# Patient Record
Sex: Female | Born: 1940 | Race: White | Hispanic: No | Marital: Married | Smoking: Former smoker
Health system: Southern US, Community
[De-identification: ages and names within clinical notes are randomized; demographics above are authoritative.]

## PROBLEM LIST (undated history)

## (undated) DIAGNOSIS — I10 Essential (primary) hypertension: Secondary | ICD-10-CM

## (undated) HISTORY — PX: ABDOMINAL HYSTERECTOMY: SHX81

## (undated) HISTORY — PX: CHOLECYSTECTOMY: SHX55

## (undated) HISTORY — PX: APPENDECTOMY: SHX54

## (undated) HISTORY — PX: TONSILLECTOMY: SUR1361

---

## 2012-11-07 ENCOUNTER — Emergency Department: Payer: Self-pay | Admitting: Emergency Medicine

## 2014-04-03 ENCOUNTER — Emergency Department: Payer: Self-pay | Admitting: Internal Medicine

## 2014-09-06 ENCOUNTER — Emergency Department: Payer: Self-pay | Admitting: Emergency Medicine

## 2015-06-09 ENCOUNTER — Encounter: Payer: Self-pay | Admitting: Emergency Medicine

## 2015-06-09 ENCOUNTER — Ambulatory Visit
Admission: EM | Admit: 2015-06-09 | Discharge: 2015-06-09 | Disposition: A | Discharge status: Home / Self Care | Payer: Medicare Other | Attending: Family Medicine | Admitting: Family Medicine

## 2015-06-09 DIAGNOSIS — J0110 Acute frontal sinusitis, unspecified: Secondary | ICD-10-CM

## 2015-06-09 DIAGNOSIS — H6593 Unspecified nonsuppurative otitis media, bilateral: Secondary | ICD-10-CM | POA: Diagnosis not present

## 2015-06-09 DIAGNOSIS — J209 Acute bronchitis, unspecified: Secondary | ICD-10-CM

## 2015-06-09 DIAGNOSIS — J029 Acute pharyngitis, unspecified: Secondary | ICD-10-CM | POA: Diagnosis not present

## 2015-06-09 HISTORY — DX: Essential (primary) hypertension: I10

## 2015-06-09 MED ORDER — FLUTICASONE PROPIONATE 50 MCG/ACT NA SUSP: 1.0000 | Freq: Two times a day (BID) | NASAL | Status: AC

## 2015-06-09 MED ORDER — ALBUTEROL SULFATE HFA 108 (90 BASE) MCG/ACT IN AERS: 1.0000 | INHALATION_SPRAY | RESPIRATORY_TRACT | Status: DC | PRN

## 2015-06-09 MED ORDER — SALINE SPRAY 0.65 % NA SOLN: 2.0000 | NASAL | Status: AC

## 2015-06-09 MED ORDER — DOXYCYCLINE HYCLATE 100 MG PO CAPS: 100.0000 mg | ORAL_CAPSULE | Freq: Two times a day (BID) | ORAL | Status: AC

## 2015-06-09 MED ORDER — MONTELUKAST SODIUM 10 MG PO TABS: 10.0000 mg | ORAL_TABLET | Freq: Every day | ORAL | Status: AC

## 2015-06-09 NOTE — Discharge Instructions (Signed)
Acute Bronchitis °Bronchitis is inflammation of the airways that extend from the windpipe into the lungs (bronchi). The inflammation often causes mucus to develop. This leads to a cough, which is the most common symptom of bronchitis.  °In acute bronchitis, the condition usually develops suddenly and goes away over time, usually in a couple weeks. Smoking, allergies, and asthma can make bronchitis worse. Repeated episodes of bronchitis may cause further lung problems.  °CAUSES °Acute bronchitis is most often caused by the same virus that causes a cold. The virus can spread from person to person (contagious) through coughing, sneezing, and touching contaminated objects. °SIGNS AND SYMPTOMS  °· Cough.   °· Fever.   °· Coughing up mucus.   °· Body aches.   °· Chest congestion.   °· Chills.   °· Shortness of breath.   °· Sore throat.   °DIAGNOSIS  °Acute bronchitis is usually diagnosed through a physical exam. Your health care provider will also ask you questions about your medical history. Tests, such as chest X-rays, are sometimes done to rule out other conditions.  °TREATMENT  °Acute bronchitis usually goes away in a couple weeks. Oftentimes, no medical treatment is necessary. Medicines are sometimes given for relief of fever or cough. Antibiotic medicines are usually not needed but may be prescribed in certain situations. In some cases, an inhaler may be recommended to help reduce shortness of breath and control the cough. A cool mist vaporizer may also be used to help thin bronchial secretions and make it easier to clear the chest.  °HOME CARE INSTRUCTIONS °· Get plenty of rest.   °· Drink enough fluids to keep your urine clear or pale yellow (unless you have a medical condition that requires fluid restriction). Increasing fluids may help thin your respiratory secretions (sputum) and reduce chest congestion, and it will prevent dehydration.   °· Take medicines only as directed by your health care provider. °· If  you were prescribed an antibiotic medicine, finish it all even if you start to feel better. °· Avoid smoking and secondhand smoke. Exposure to cigarette smoke or irritating chemicals will make bronchitis worse. If you are a smoker, consider using nicotine gum or skin patches to help control withdrawal symptoms. Quitting smoking will help your lungs heal faster.   °· Reduce the chances of another bout of acute bronchitis by washing your hands frequently, avoiding people with cold symptoms, and trying not to touch your hands to your mouth, nose, or eyes.   °· Keep all follow-up visits as directed by your health care provider.   °SEEK MEDICAL CARE IF: °Your symptoms do not improve after 1 week of treatment.  °SEEK IMMEDIATE MEDICAL CARE IF: °· You develop an increased fever or chills.   °· You have chest pain.   °· You have severe shortness of breath. °· You have bloody sputum.   °· You develop dehydration. °· You faint or repeatedly feel like you are going to pass out. °· You develop repeated vomiting. °· You develop a severe headache. °MAKE SURE YOU:  °· Understand these instructions. °· Will watch your condition. °· Will get help right away if you are not doing well or get worse. °  °This information is not intended to replace advice given to you by your health care provider. Make sure you discuss any questions you have with your health care provider. °  °Document Released: 08/22/2004 Document Revised: 08/05/2014 Document Reviewed: 01/05/2013 °[employer] Interactive Patient Education ©2016 [employer] °Sinusitis, Adult °Sinusitis is redness, soreness, and inflammation of the paranasal sinuses. Paranasal sinuses are air pockets within the   bones of your face. They are located beneath your eyes, in the middle of your forehead, and above your eyes. In healthy paranasal sinuses, mucus is able to drain out, and air is able to circulate through them by way of your nose. However, when your paranasal sinuses are inflamed,  mucus and air can become trapped. This can allow bacteria and other germs to grow and cause infection. °Sinusitis can develop quickly and last only a short time (acute) or continue over a long period (chronic). Sinusitis that lasts for more than 12 weeks is considered chronic. °CAUSES °Causes of sinusitis include: °· Allergies. °· Structural abnormalities, such as displacement of the cartilage that separates your nostrils (deviated septum), which can decrease the air flow through your nose and sinuses and affect sinus drainage. °· Functional abnormalities, such as when the small hairs (cilia) that line your sinuses and help remove mucus do not work properly or are not present. °SIGNS AND SYMPTOMS °Symptoms of acute and chronic sinusitis are the same. The primary symptoms are pain and pressure around the affected sinuses. Other symptoms include: °· Upper toothache. °· Earache. °· Headache. °· Bad breath. °· Decreased sense of smell and taste. °· A cough, which worsens when you are lying flat. °· Fatigue. °· Fever. °· Thick drainage from your nose, which often is green and may contain pus (purulent). °· Swelling and warmth over the affected sinuses. °DIAGNOSIS °Your health care provider will perform a physical exam. During your exam, your health care provider may perform any of the following to help determine if you have acute sinusitis or chronic sinusitis: °· Look in your nose for signs of abnormal growths in your nostrils (nasal polyps). °· Tap over the affected sinus to check for signs of infection. °· View the inside of your sinuses using an imaging device that has a light attached (endoscope). °If your health care provider suspects that you have chronic sinusitis, one or more of the following tests may be recommended: °· Allergy tests. °· Nasal culture. A sample of mucus is taken from your nose, sent to a lab, and screened for bacteria. °· Nasal cytology. A sample of mucus is taken from your nose and examined by  your health care provider to determine if your sinusitis is related to an allergy. °TREATMENT °Most cases of acute sinusitis are related to a viral infection and will resolve on their own within 10 days. Sometimes, medicines are prescribed to help relieve symptoms of both acute and chronic sinusitis. These may include pain medicines, decongestants, nasal steroid sprays, or saline sprays. °However, for sinusitis related to a bacterial infection, your health care provider will prescribe antibiotic medicines. These are medicines that will help kill the bacteria causing the infection. °Rarely, sinusitis is caused by a fungal infection. In these cases, your health care provider will prescribe antifungal medicine. °For some cases of chronic sinusitis, surgery is needed. Generally, these are cases in which sinusitis recurs more than 3 times per year, despite other treatments. °HOME CARE INSTRUCTIONS °· Drink plenty of water. Water helps thin the mucus so your sinuses can drain more easily. °· Use a humidifier. °· Inhale steam 3-4 times a day (for example, sit in the bathroom with the shower running). °· Apply a warm, moist washcloth to your face 3-4 times a day, or as directed by your health care provider. °· Use saline nasal sprays to help moisten and clean your sinuses. °· Take medicines only as directed by your health care provider. °· If   you were prescribed either an antibiotic or antifungal medicine, finish it all even if you start to feel better. °SEEK IMMEDIATE MEDICAL CARE IF: °· You have increasing pain or severe headaches. °· You have nausea, vomiting, or drowsiness. °· You have swelling around your face. °· You have vision problems. °· You have a stiff neck. °· You have difficulty breathing. °  °This information is not intended to replace advice given to you by your health care provider. Make sure you discuss any questions you have with your health care provider. °  °Document Released: 07/15/2005 Document  Revised: 08/05/2014 Document Reviewed: 07/30/2011 °[employer] Interactive Patient Education ©2016 [employer] °Pharyngitis °Pharyngitis is redness, pain, and swelling (inflammation) of your pharynx.  °CAUSES  °Pharyngitis is usually caused by infection. Most of the time, these infections are from viruses (viral) and are part of a cold. However, sometimes pharyngitis is caused by bacteria (bacterial). Pharyngitis can also be caused by allergies. Viral pharyngitis may be spread from person to person by coughing, sneezing, and personal items or utensils (cups, forks, spoons, toothbrushes). Bacterial pharyngitis may be spread from person to person by more intimate contact, such as kissing.  °SIGNS AND SYMPTOMS  °Symptoms of pharyngitis include:   °· Sore throat.   °· Tiredness (fatigue).   °· Low-grade fever.   °· Headache. °· Joint pain and muscle aches. °· Skin rashes. °· Swollen lymph nodes. °· Plaque-like film on throat or tonsils (often seen with bacterial pharyngitis). °DIAGNOSIS  °Your health care provider will ask you questions about your illness and your symptoms. Your medical history, along with a physical exam, is often all that is needed to diagnose pharyngitis. Sometimes, a rapid strep test is done. Other lab tests may also be done, depending on the suspected cause.  °TREATMENT  °Viral pharyngitis will usually get better in 3-4 days without the use of medicine. Bacterial pharyngitis is treated with medicines that kill germs (antibiotics).  °HOME CARE INSTRUCTIONS  °· Drink enough water and fluids to keep your urine clear or pale yellow.   °· Only take over-the-counter or prescription medicines as directed by your health care provider:   °· If you are prescribed antibiotics, make sure you finish them even if you start to feel better.   °· Do not take aspirin.   °· Get lots of rest.   °· Gargle with 8 oz of salt water (½ tsp of salt per 1 qt of water) as often as every 1-2 hours to soothe your throat.    °· Throat lozenges (if you are not at risk for choking) or sprays may be used to soothe your throat. °SEEK MEDICAL CARE IF:  °· You have large, tender lumps in your neck. °· You have a rash. °· You cough up green, yellow-brown, or bloody spit. °SEEK IMMEDIATE MEDICAL CARE IF:  °· Your neck becomes stiff. °· You drool or are unable to swallow liquids. °· You vomit or are unable to keep medicines or liquids down. °· You have severe pain that does not go away with the use of recommended medicines. °· You have trouble breathing (not caused by a stuffy nose). °MAKE SURE YOU:  °· Understand these instructions. °· Will watch your condition. °· Will get help right away if you are not doing well or get worse. °  °This information is not intended to replace advice given to you by your health care provider. Make sure you discuss any questions you have with your health care provider. °  °Document Released: 07/15/2005 Document Revised: 05/05/2013 Document Reviewed: 03/22/2013 °  [employer] Interactive Patient Education ©2016 [employer] °Otitis Media With Effusion °Otitis media with effusion is the presence of fluid in the middle ear. This is a common problem in children, which often follows ear infections. It may be present for weeks or longer after the infection. Unlike an acute ear infection, otitis media with effusion refers only to fluid behind the ear drum and not infection. Children with repeated ear and sinus infections and allergy problems are the most likely to get otitis media with effusion. °CAUSES  °The most frequent cause of the fluid buildup is dysfunction of the eustachian tubes. These are the tubes that drain fluid in the ears to the back of the nose (nasopharynx). °SYMPTOMS  °· The main symptom of this condition is hearing loss. As a result, you or your child may: °· Listen to the TV at a loud volume. °· Not respond to questions. °· Ask "what" often when spoken to. °· Mistake or confuse one sound or word for  another. °· There may be a sensation of fullness or pressure but usually not pain. °DIAGNOSIS  °· Your health care provider will diagnose this condition by examining you or your child's ears. °· Your health care provider may test the pressure in you or your child's ear with a tympanometer. °· A hearing test may be conducted if the problem persists. °TREATMENT  °· Treatment depends on the duration and the effects of the effusion. °· Antibiotics, decongestants, nose drops, and cortisone-type drugs (tablets or nasal spray) may not be helpful. °· Children with persistent ear effusions may have delayed language or behavioral problems. Children at risk for developmental delays in hearing, learning, and speech may require referral to a specialist earlier than children not at risk. °· You or your child's health care provider may suggest a referral to an ear, nose, and throat surgeon for treatment. The following may help restore normal hearing: °· Drainage of fluid. °· Placement of ear tubes (tympanostomy tubes). °· Removal of adenoids (adenoidectomy). °HOME CARE INSTRUCTIONS  °· Avoid secondhand smoke. °· Infants who are breastfed are less likely to have this condition. °· Avoid feeding infants while they are lying flat. °· Avoid known environmental allergens. °· Avoid people who are sick. °SEEK MEDICAL CARE IF:  °· Hearing is not better in 3 months. °· Hearing is worse. °· Ear pain. °· Drainage from the ear. °· Dizziness. °MAKE SURE YOU:  °· Understand these instructions. °· Will watch your condition. °· Will get help right away if you are not doing well or get worse. °  °This information is not intended to replace advice given to you by your health care provider. Make sure you discuss any questions you have with your health care provider. °  °Document Released: 08/22/2004 Document Revised: 08/05/2014 Document Reviewed: 02/09/2013 °[employer] Interactive Patient Education ©2016 [employer] ° °

## 2015-06-09 NOTE — ED Provider Notes (Signed)
CSN: [id number]     Arrival date & time 06/09/15  1627 History   First MD Initiated Contact with Patient 06/09/15 1727     Chief Complaint  Patient presents with  . Facial Pain  . Nasal Congestion   (Consider location/radiation/quality/duration/timing/severity/associated sxs/prior Treatment) HPI Comments: Married [place] female here with granddaughter for evaluation of productive cough green; granddaughter also sick with bronchitis; has been doing steam shower started as nasal congestion now in her chest, + headache, body aches, today felt hot, chest pressure; has nebulizer at home hasn't tried it, tessalon pearles at home leftover from previous illness hasn't tried; alkaseltzer x 1 didn't help throat allergic penicillin and red dye (azithromycin); has used doxycycline in past with good results ok to try nose sprays but hasn't used yet  The history is provided by the patient.    Past Medical History  Diagnosis Date  . Hypertension    Past Surgical History  Procedure Laterality Date  . Abdominal hysterectomy    . Cholecystectomy    . Appendectomy    . Tonsillectomy     History reviewed. No pertinent family history. Social History  Substance Use Topics  . Smoking status: Former Games developer  . Smokeless tobacco: None  . Alcohol Use: No   OB History    No data available     Review of Systems  Constitutional: Positive for diaphoresis. Negative for fever, chills, activity change, appetite change, fatigue and unexpected weight change.  HENT: Positive for congestion, ear pain, nosebleeds, postnasal drip and sinus pressure. Negative for dental problem, drooling, ear discharge, facial swelling, hearing loss, mouth sores, rhinorrhea, sneezing, sore throat, tinnitus, trouble swallowing and voice change.   Eyes: Negative for photophobia, pain, discharge, redness, itching and visual disturbance.  Respiratory: Positive for cough. Negative for choking, chest tightness, shortness of breath,  wheezing and stridor.   Cardiovascular: Negative for chest pain, palpitations and leg swelling.  Gastrointestinal: Negative for nausea, vomiting, abdominal pain, diarrhea, constipation, blood in stool and abdominal distention.  Endocrine: Negative for cold intolerance and heat intolerance.  Genitourinary: Negative for dysuria, hematuria and difficulty urinating.  Musculoskeletal: Positive for myalgias. Negative for back pain, joint swelling, arthralgias, gait problem, neck pain and neck stiffness.  Skin: Negative for color change, pallor, rash and wound.  Allergic/Immunologic: Positive for environmental allergies. Negative for food allergies.  Neurological: Positive for headaches. Negative for dizziness, tremors, seizures, syncope, facial asymmetry, speech difficulty, weakness, light-headedness and numbness.  Hematological: Negative for adenopathy. Does not bruise/bleed easily.  Psychiatric/Behavioral: Negative for behavioral problems, confusion, sleep disturbance and agitation.    Allergies  Penicillins  Home Medications   Prior to Admission medications   Medication Sig Start Date End Date Taking? Authorizing Provider  amLODipine (NORVASC) 5 MG tablet Take 5 mg by mouth daily.   Yes Historical Provider, MD  citalopram (CELEXA) 10 MG tablet Take 10 mg by mouth daily.   Yes Historical Provider, MD  losartan (COZAAR) 25 MG tablet Take 25 mg by mouth daily.   Yes Historical Provider, MD  Metoprolol Succinate (TOPROL XL PO) Take 25 mg by mouth daily.   Yes Historical Provider, MD  Multiple Vitamin (MULTIVITAMIN) tablet Take 1 tablet by mouth daily.   Yes Historical Provider, MD  albuterol (PROVENTIL HFA;VENTOLIN HFA) 108 (90 BASE) MCG/ACT inhaler Inhale 1-2 puffs into the lungs every 4 (four) hours as needed for wheezing or shortness of breath. 06/09/15   Barbaraann Barthel, NP  doxycycline (VIBRAMYCIN) 100 MG capsule Take 1 capsule (100  mg total) by mouth 2 (two) times daily. 06/09/15   Barbaraann Barthel, NP  fluticasone (FLONASE) 50 MCG/ACT nasal spray Place 1 spray into both nostrils 2 (two) times daily. 06/09/15   Barbaraann Barthel, NP  montelukast (SINGULAIR) 10 MG tablet Take 1 tablet (10 mg total) by mouth at bedtime. 06/09/15   Barbaraann Barthel, NP  sodium chloride (OCEAN) 0.65 % SOLN nasal spray Place 2 sprays into both nostrils every 2 (two) hours while awake. 06/09/15   Barbaraann Barthel, NP   Meds Ordered and Administered this Visit  Medications - No data to display  BP 146/73 mmHg  Pulse 65  Temp(Src) 99.1 F (37.3 C) (Tympanic)  Resp 17  Ht  (1.575 m)  Wt 186 lb (84.369 kg)  BMI 34.01 kg/m2  SpO2 96% No data found.   Physical Exam  Constitutional: She is oriented to person, place, and time. She appears well-developed and well-nourished. She is active and cooperative.  Non-toxic appearance. She does not have a sickly appearance. She appears ill. No distress.  HENT:  Head: Normocephalic and atraumatic.  Right Ear: Hearing, external ear and ear canal normal. A middle ear effusion is present.  Left Ear: Hearing, external ear and ear canal normal. A middle ear effusion is present.  Nose: Mucosal edema and rhinorrhea present. No nose lacerations, sinus tenderness, nasal deformity, septal deviation or nasal septal hematoma. No epistaxis.  No foreign bodies. Right sinus exhibits maxillary sinus tenderness and frontal sinus tenderness. Left sinus exhibits maxillary sinus tenderness and frontal sinus tenderness.  Mouth/Throat: Uvula is midline and mucous membranes are normal. Mucous membranes are not pale, not dry and not cyanotic. She does not have dentures. No oral lesions. No trismus in the jaw. Normal dentition. No dental abscesses, uvula swelling, lacerations or dental caries. Posterior oropharyngeal edema and posterior oropharyngeal erythema present. No oropharyngeal exudate or tonsillar abscesses.  Eyes: Conjunctivae, EOM and lids are normal. Pupils are equal,  round, and reactive to light. Right eye exhibits no chemosis, no discharge, no exudate and no hordeolum. No foreign body present in the right eye. Left eye exhibits no chemosis, no discharge, no exudate and no hordeolum. No foreign body present in the left eye. Right conjunctiva is not injected. Right conjunctiva has no hemorrhage. Left conjunctiva is not injected. Left conjunctiva has no hemorrhage. No scleral icterus. Right eye exhibits normal extraocular motion and no nystagmus. Left eye exhibits normal extraocular motion and no nystagmus. Right pupil is round and reactive. Left pupil is round and reactive. Pupils are equal.  Neck: Trachea normal and normal range of motion. Neck supple. No tracheal tenderness, no spinous process tenderness and no muscular tenderness present. No rigidity. No tracheal deviation, no edema, no erythema and normal range of motion present. No thyroid mass and no thyromegaly present.  Cardiovascular: Normal rate, regular rhythm, S1 normal, S2 normal, normal heart sounds and intact distal pulses.  PMI is not displaced.  Exam reveals no gallop and no friction rub.   No murmur heard. Pulmonary/Chest: Effort normal and breath sounds normal. No accessory muscle usage or stridor. No respiratory distress. She has no decreased breath sounds. She has no wheezes. She has no rhonchi. She has no rales. She exhibits no tenderness.  Abdominal: Soft. Bowel sounds are normal. She exhibits no shifting dullness, no distension, no pulsatile liver, no fluid wave, no abdominal bruit, no ascites, no pulsatile midline mass and no mass. There is no hepatosplenomegaly. There is no tenderness. There  is no rigidity, no rebound, no guarding, no tenderness at McBurney's point and negative Murphy's sign. Hernia confirmed negative in the ventral area.  Dull to percussion x 4 quads  Musculoskeletal: Normal range of motion. She exhibits no edema or tenderness.       Right shoulder: Normal.       Left shoulder:  Normal.       Right elbow: Normal.      Left elbow: Normal.       Right hip: Normal.       Left hip: Normal.       Right knee: Normal.       Left knee: Normal.       Cervical back: Normal.       Right hand: Normal.       Left hand: Normal.  Lymphadenopathy:       Head (right side): No submental, no submandibular, no tonsillar, no preauricular, no posterior auricular and no occipital adenopathy present.       Head (left side): No submental, no submandibular, no tonsillar, no preauricular, no posterior auricular and no occipital adenopathy present.    She has no cervical adenopathy.       Right cervical: No superficial cervical, no deep cervical and no posterior cervical adenopathy present.      Left cervical: No superficial cervical, no deep cervical and no posterior cervical adenopathy present.  Neurological: She is alert and oriented to person, place, and time. She has normal strength. She is not disoriented. She displays no atrophy and no tremor. No cranial nerve deficit or sensory deficit. She exhibits normal muscle tone. She displays no seizure activity. Coordination and gait normal. GCS eye subscore is 4. GCS verbal subscore is 5. GCS motor subscore is 6.  Skin: Skin is warm, dry and intact. No abrasion, no bruising, no burn, no ecchymosis, no laceration, no lesion, no petechiae and no rash noted. She is not diaphoretic. No cyanosis or erythema. No pallor. Nails show no clubbing.  Psychiatric: She has a normal mood and affect. Her speech is normal and behavior is normal. Judgment and thought content normal. Cognition and memory are normal.  Nursing note and vitals reviewed.   ED Course  Procedures (including critical care time)  Labs Review Labs Reviewed - No data to display  Imaging Review No results found.  Nebulizer administered by  MDM   1. Acute bronchitis, unspecified organism   2. Acute pharyngitis, unspecified pharyngitis type   3. Acute frontal sinusitis, recurrence  not specified   4. Otitis media with effusion, bilateral   start albuterol nebulizers and tessalon 200mg  po TID prn cough at home left over from previous URI, flonase 1 spray each nostril BID for post nasal drip consider starting singulair 10mg  po daily as patient feels she has never had good control seasonal allergies.  If no improvement with flonase and saline nasal sprays.  Bronchitis simple, community acquired, may have started as viral (probably respiratory syncytial, parainfluenza, influenza, or adenovirus), but now evidence of acute purulent bronchitis with resultant bronchial edema and mucus formation.  Viruses are the most common cause of bronchial inflammation in otherwise healthy adults with acute bronchitis.  The appearance of sputum is not predictive of whether a bacterial infection is present.  Purulent sputum is most often caused by viral infections.  There are a small portion of those caused by non-viral agents being Mycoplamsa pneumonia.  Microscopic examination or C&S of sputum in the healthy adult with acute bronchitis is  generally not helpful (usually negative or normal respiratory flora) other considerations being cough from upper respiratory tract infections, sinusitis or allergic syndromes (mild asthma or viral pneumonia).  Differential Diagnosis:  reactive airway disease (asthma, allergic aspergillosis (eosinophilia), chronic bronchitis, respiratory infection (Sinusitis, Common cold, pneumonia), congestive heart failure, reflux esophagitis, bronchogenic tumor, aspiration syndromes and/or exposure irritants/tobacco smoke.  In this case, there is no evidence of any invasive bacterial illness.  Most likely viral etiology so will hold on antibiotic treatment.  Advise supportive care with rest, encourage fluids, good hygiene and watch for any worsening symptoms.  If they were to develop:  come back to the office or go to the emergency room if after hours. Without high fever, severe dyspnea,  lack of physical findings or other risk factors, I will hold on a chest radiograph and CBC at this time.I discussed that approximately 50% of patients with acute bronchitis have a cough that lasts up to three weeks, and 25% for over a month.  Tylenol, one to two tablets every four hours as needed for fever or myalgias.   No aspirin.  Patient instructed to follow up in one week or sooner if symptoms worsen. Patient verbalized agreement and understanding of treatment plan.  P2:  hand washing and cover cough  If no improvement with flonase, singulair and saline start doxycycline  po BID x 10 days.  Allergic to penicillin and red dye medications.  No evidence of systemic bacterial infection, non toxic and well hydrated.  I do not see where any further testing or imaging is necessary at this time.   I will suggest supportive care, rest, good hygiene and encourage the patient to take adequate fluids.  The patient is to return to clinic or EMERGENCY ROOM if symptoms worsen or change significantly.  Exitcare handout on sinusitis given to patient.  Patient verbalized agreement and understanding of treatment plan and had no further questions at this time.   P2:  Hand washing and cover cough  Suspect due to post nasal drip.  Rx given for sinusitis treatment.  Tylenol  po QID prn pain/salt water gargles, honey with lemon, throat lozenges recommended.   Usually no specific medical treatment is needed if a virus is causing the sore throat.  The throat most often gets better on its own within 5 to 7 days.  Antibiotic medicine does not cure viral pharyngitis.   For acute pharyngitis caused by bacteria, your healthcare provider will prescribe an antibiotic.  Marland Kitchen Do not smoke.  Marland Kitchen Avoid secondhand smoke and other air pollutants.  . Use a cool mist humidifier to add moisture to the air.  . Get plenty of rest.  . You may want to rest your throat by talking less and eating a diet that is mostly liquid or soft for a  day or two.   Marland Kitchen Nonprescription throat lozenges and mouthwashes should help relieve the soreness.   . Gargling with warm saltwater and drinking warm liquids may help.  (You can make a saltwater solution by adding 1/4 teaspoon of salt to 8 ounces, or 240 mL, of warm water.)  . A nonprescription pain reliever such as aspirin, acetaminophen, or ibuprofen may ease general aches and pains.   FOLLOW UP with clinic provider if no improvements in the next 7-10 days. exitcare handout on pharyngitis given.   Patient verbalized understanding of instructions and agreed with plan of care and had no further questions at this time. P2:  Hand washing and diet.  Supportive  treatment.   No evidence of invasive bacterial infection, non toxic and well hydrated.  This is most likely self limiting viral infection.  I do not see where any further testing or imaging is necessary at this time.   I will suggest supportive care, rest, good hygiene and encourage the patient to take adequate fluids.  The patient is to return to clinic or EMERGENCY ROOM if symptoms worsen or change significantly e.g. ear pain, fever, purulent discharge from ears or bleeding.  Exitcare handout on otitis media with effusion given to patient.  Patient verbalized agreement and understanding of treatment plan.      Barbaraann Barthel, NP 06/10/15 1847

## 2015-06-09 NOTE — ED Notes (Signed)
Patient c/o sinus congestion and pressure and cough since last night.

## 2015-10-19 ENCOUNTER — Emergency Department: Payer: Medicare HMO

## 2015-10-19 ENCOUNTER — Emergency Department
Admission: EM | Admit: 2015-10-19 | Discharge: 2015-10-19 | Disposition: A | Discharge status: Home / Self Care | Payer: Medicare HMO | Attending: Emergency Medicine | Admitting: Emergency Medicine

## 2015-10-19 DIAGNOSIS — Z79899 Other long term (current) drug therapy: Secondary | ICD-10-CM | POA: Insufficient documentation

## 2015-10-19 DIAGNOSIS — W010XXA Fall on same level from slipping, tripping and stumbling without subsequent striking against object, initial encounter: Secondary | ICD-10-CM | POA: Diagnosis not present

## 2015-10-19 DIAGNOSIS — Y998 Other external cause status: Secondary | ICD-10-CM | POA: Insufficient documentation

## 2015-10-19 DIAGNOSIS — Z87891 Personal history of nicotine dependence: Secondary | ICD-10-CM | POA: Diagnosis not present

## 2015-10-19 DIAGNOSIS — S4992XA Unspecified injury of left shoulder and upper arm, initial encounter: Secondary | ICD-10-CM | POA: Diagnosis present

## 2015-10-19 DIAGNOSIS — Z880 Allergy status to penicillin: Secondary | ICD-10-CM | POA: Diagnosis not present

## 2015-10-19 DIAGNOSIS — Z7951 Long term (current) use of inhaled steroids: Secondary | ICD-10-CM | POA: Diagnosis not present

## 2015-10-19 DIAGNOSIS — S42292A Other displaced fracture of upper end of left humerus, initial encounter for closed fracture: Secondary | ICD-10-CM

## 2015-10-19 DIAGNOSIS — Y9289 Other specified places as the place of occurrence of the external cause: Secondary | ICD-10-CM | POA: Diagnosis not present

## 2015-10-19 DIAGNOSIS — I10 Essential (primary) hypertension: Secondary | ICD-10-CM | POA: Insufficient documentation

## 2015-10-19 DIAGNOSIS — S42202A Unspecified fracture of upper end of left humerus, initial encounter for closed fracture: Secondary | ICD-10-CM | POA: Diagnosis not present

## 2015-10-19 DIAGNOSIS — Y9389 Activity, other specified: Secondary | ICD-10-CM | POA: Diagnosis not present

## 2015-10-19 DIAGNOSIS — Z792 Long term (current) use of antibiotics: Secondary | ICD-10-CM | POA: Insufficient documentation

## 2015-10-19 MED ORDER — OXYCODONE-ACETAMINOPHEN 5-325 MG PO TABS
1.0000 | ORAL_TABLET | Freq: Once | ORAL | Status: AC
  Administered 2015-10-19: 1 via ORAL
  Filled 2015-10-19: qty 1

## 2015-10-19 MED ORDER — OXYCODONE-ACETAMINOPHEN 5-325 MG PO TABS: 1.0000 | ORAL_TABLET | Freq: Four times a day (QID) | ORAL | Status: DC | PRN

## 2015-10-19 NOTE — ED Provider Notes (Signed)
[facility] Emergency Department Provider Note  ____________________________________________  Time seen: Approximately 5:29 PM  I have reviewed the triage vital signs and the nursing notes.   HISTORY  Chief Complaint Shoulder Injury    HPI Brandi Perry is a [age] y.o. female who presents emergency department complaining of left shoulder pain. Patient states that she had a mechanical fall today and landed directly on the left shoulder. Patient states that she is having significant amount of pain to the lateral aspect of the left shoulder. She did not hit her head or lose consciousness. She denies any neck pain. She does endorse some radiating pain from shoulder down into the left elbow. She denies any numbness and tingling in her distal extremity. She states the pain is sharp, constant, moderate to severe in nature. Patient has taken meloxicam at home prior to arrival. Patient denies being on blood thinners   Past Medical History  Diagnosis Date  . Hypertension     There are no active problems to display for this patient.   Past Surgical History  Procedure Laterality Date  . Abdominal hysterectomy    . Cholecystectomy    . Appendectomy    . Tonsillectomy      Current Outpatient Rx  Name  Route  Sig  Dispense  Refill  . albuterol (PROVENTIL HFA;VENTOLIN HFA) 108 (90 BASE) MCG/ACT inhaler   Inhalation   Inhale 1-2 puffs into the lungs every 4 (four) hours as needed for wheezing or shortness of breath.   1 Inhaler   0   . amLODipine (NORVASC) 5 MG tablet   Oral   Take 5 mg by mouth daily.         . citalopram (CELEXA) 10 MG tablet   Oral   Take 10 mg by mouth daily.         Marland Kitchen. doxycycline (VIBRAMYCIN) 100 MG capsule   Oral   Take 1 capsule (100 mg total) by mouth 2 (two) times daily.   20 capsule   0   . fluticasone (FLONASE) 50 MCG/ACT nasal spray   Each Nare   Place 1 spray into both nostrils 2 (two) times daily.   16 g   0   .  losartan (COZAAR) 25 MG tablet   Oral   Take 25 mg by mouth daily.         . Metoprolol Succinate (TOPROL XL PO)   Oral   Take 25 mg by mouth daily.         . montelukast (SINGULAIR) 10 MG tablet   Oral   Take 1 tablet (10 mg total) by mouth at bedtime.   30 tablet   0   . Multiple Vitamin (MULTIVITAMIN) tablet   Oral   Take 1 tablet by mouth daily.         Marland Kitchen. oxyCODONE-acetaminophen (ROXICET) 5-325 MG tablet   Oral   Take 1 tablet by mouth every 6 (six) hours as needed for severe pain.   20 tablet   0   . sodium chloride (OCEAN) 0.65 % SOLN nasal spray   Each Nare   Place 2 sprays into both nostrils every 2 (two) hours while awake.      0     Allergies Penicillins  No family history on file.  Social History Social History  Substance Use Topics  . Smoking status: Former Games developermoker  . Smokeless tobacco: Not on file  . Alcohol Use: No     Review of  Systems  Eyes: No visual changes.  Cardiovascular: no chest pain. Respiratory: no cough. No SOB. Musculoskeletal: Negative for back pain. Positive for left shoulder pain. Skin: Negative for rash. Denies lacerations or abrasions. Neurological: Negative for headaches, focal weakness or numbness. 10-point ROS otherwise negative.  ____________________________________________   PHYSICAL EXAM:  VITAL SIGNS: ED Triage Vitals  Enc Vitals Group     BP 10/19/15 1545 166/83 mmHg     Pulse Rate 10/19/15 1545 76     Resp 10/19/15 1545 18     Temp 10/19/15 1545 98 F (36.7 C)     Temp Source 10/19/15 1545 Oral     SpO2 10/19/15 1545 95 %     Weight 10/19/15 1545 190 lb (86.183 kg)     Height 10/19/15 1545  (1.575 m)     Head Cir --      Peak Flow --      Pain Score 10/19/15 1549 10     Pain Loc --      Pain Edu? --      Excl. in GC? --      Constitutional: Alert and oriented. Well appearing and in no acute distress. Eyes: Conjunctivae are normal. PERRL. EOMI. Head: Atraumatic. Neck: No stridor.  No  cervical spine tenderness to palpation. Cardiovascular: Normal rate, regular rhythm. Normal S1 and S2.  Good peripheral circulation. Respiratory: Normal respiratory effort without tachypnea or retractions. Lungs CTAB. Musculoskeletal: No visible deformity to left shoulder when compared with right. Mild edema noted to the anterior lateral aspect of the shoulder. No abrasions, contusions, lacerations are noted. The range of motion due to pain. Patient is nontender to palpation over the anterior posterior aspect of the shoulder. Patient is exquisitely tender to palpation over the lateral aspect of the shoulder over the proximal humerus. No palpable abnormality. No point tenderness to palpation of the left elbow. Full range of motion to elbow. Radial pulses appreciated bilaterally. Sensation intact and equal all pressure nares. Neurologic:  Normal speech and language. No gross focal neurologic deficits are appreciated.  Skin:  Skin is warm, dry and intact. No rash noted. Psychiatric: Mood and affect are normal. Speech and behavior are normal. Patient exhibits appropriate insight and judgement.   ____________________________________________   LABS (all labs ordered are listed, but only abnormal results are displayed)  Labs Reviewed - No data to display ____________________________________________  EKG   ____________________________________________  RADIOLOGY Festus Barren Obe Ahlers, personally viewed and evaluated these images (plain radiographs) as part of my medical decision making, as well as reviewing the written report by the radiologist.  Dg Shoulder Left  10/19/2015  CLINICAL DATA:  Pain following fall EXAM: LEFT SHOULDER - 2+ VIEW COMPARISON:  None. FINDINGS: Frontal, oblique, and Y scapular images were obtained. There is avulsion of the greater tuberosity of the proximal humerus with mild lateral displacement of the greater tuberosity. No other fracture. No dislocation. There is mild  generalized osteoarthritic change. No erosive change. Visualized left lung clear. IMPRESSION: Avulsion of the greater tuberosity from the remainder the humerus. No other fracture. No dislocation. Moderate osteoarthritic change. Electronically Signed   By: Bretta Bang III M.D.   On: 10/19/2015 16:47    ____________________________________________    PROCEDURES  Procedure(s) performed:       Medications  oxyCODONE-acetaminophen (PERCOCET/ROXICET) 5-325 MG per tablet 1 tablet (1 tablet Oral Given 10/19/15 1627)     ____________________________________________   INITIAL IMPRESSION / ASSESSMENT AND PLAN / ED COURSE  Pertinent labs &  imaging results that were available during my care of the patient were reviewed by me and considered in my medical decision making (see chart for details).  Patient's diagnosis is consistent with An avulsion fracture to the left humeral head. Patient is given a sling here in the emergency department. She is advised to follow-up with orthopedics for further evaluation and treatment. Patient will be discharged home with prescriptions for pain medication and advised that she can use her at home prescription of meloxicam for additional symptom control.  Patient is given ED precautions to return to the ED for any worsening or new symptoms.     ____________________________________________  FINAL CLINICAL IMPRESSION(S) / ED DIAGNOSES  Final diagnoses:  Humeral head fracture, left, closed, initial encounter      NEW MEDICATIONS STARTED DURING THIS VISIT:  New Prescriptions   OXYCODONE-ACETAMINOPHEN (ROXICET) 5-325 MG TABLET    Take 1 tablet by mouth every 6 (six) hours as needed for severe pain.        This chart was dictated using voice recognition software/Dragon. Despite best efforts to proofread, errors can occur which can change the meaning. Any change was purely unintentional.    Racheal Patches, PA-C 10/19/15 1745  Jene Every, MD 10/19/15 1949

## 2015-10-19 NOTE — ED Notes (Signed)
Pt has left shoulder pain.  Pt states she tripped over a stool and fell today.  Pt has pain with movement of left shoulder. Pt alert.

## 2015-10-19 NOTE — ED Notes (Signed)
States she tripped and fell landed on left shoulder  Having  pain to shoulder and elbow

## 2015-10-19 NOTE — Discharge Instructions (Signed)
Humerus Fracture Treated With Immobilization °The humerus is the large bone in your upper arm. You have a broken (fractured) humerus. These fractures are easily diagnosed with X-rays. °TREATMENT  °Simple fractures which will heal without disability are treated with simple immobilization. Immobilization means you will wear a cast, splint, or sling. You have a fracture which will do well with immobilization. The fracture will heal well simply by being held in a good position until it is stable enough to begin range of motion exercises. Do not take part in activities which would further injure your arm.  °HOME CARE INSTRUCTIONS  °· Put ice on the injured area. °¨ Put ice in a plastic bag. °¨ Place a towel between your skin and the bag. °¨ Leave the ice on for 15-20 minutes, 03-04 times a day. °· If you have a cast: °¨ Do not scratch the skin under the cast using sharp or pointed objects. °¨ Check the skin around the cast every day. You may put lotion on any red or sore areas. °¨ Keep your cast dry and clean. °· If you have a splint: °¨ Wear the splint as directed. °¨ Keep your splint dry and clean. °¨ You may loosen the elastic around the splint if your fingers become numb, tingle, or turn cold or blue. °· If you have a sling: °¨ Wear the sling as directed. °· Do not put pressure on any part of your cast or splint until it is fully hardened. °· Your cast or splint can be protected during bathing with a plastic bag. Do not lower the cast or splint into water. °· Only take over-the-counter or prescription medicines for pain, discomfort, or fever as directed by your caregiver. °· Do range of motion exercises as instructed by your caregiver. °· Follow up as directed by your caregiver. This is very important in order to avoid permanent injury or disability and chronic pain. °SEEK IMMEDIATE MEDICAL CARE IF:  °· Your skin or nails in the injured arm turn blue or gray. °· Your arm feels cold or numb. °· You develop severe pain  in the injured arm. °· You are having problems with the medicines you were given. °MAKE SURE YOU:  °· Understand these instructions. °· Will watch your condition. °· Will get help right away if you are not doing well or get worse. °  °This information is not intended to replace advice given to you by your health care provider. Make sure you discuss any questions you have with your health care provider. °  °Document Released: 10/21/2000 Document Revised: 08/05/2014 Document Reviewed: 12/07/2014 °Elsevier Interactive Patient Education ©2016 [employer] ° °

## 2016-07-24 ENCOUNTER — Emergency Department
Admission: EM | Admit: 2016-07-24 | Discharge: 2016-07-24 | Disposition: A | Discharge status: Home / Self Care | Payer: Medicare HMO | Attending: Emergency Medicine | Admitting: Emergency Medicine

## 2016-07-24 ENCOUNTER — Encounter: Payer: Self-pay | Admitting: Emergency Medicine

## 2016-07-24 ENCOUNTER — Emergency Department: Payer: Medicare HMO

## 2016-07-24 DIAGNOSIS — S6992XA Unspecified injury of left wrist, hand and finger(s), initial encounter: Secondary | ICD-10-CM | POA: Diagnosis present

## 2016-07-24 DIAGNOSIS — S52612A Displaced fracture of left ulna styloid process, initial encounter for closed fracture: Secondary | ICD-10-CM | POA: Insufficient documentation

## 2016-07-24 DIAGNOSIS — W109XXA Fall (on) (from) unspecified stairs and steps, initial encounter: Secondary | ICD-10-CM | POA: Diagnosis not present

## 2016-07-24 DIAGNOSIS — Y939 Activity, unspecified: Secondary | ICD-10-CM | POA: Diagnosis not present

## 2016-07-24 DIAGNOSIS — Z87891 Personal history of nicotine dependence: Secondary | ICD-10-CM | POA: Diagnosis not present

## 2016-07-24 DIAGNOSIS — S52502A Unspecified fracture of the lower end of left radius, initial encounter for closed fracture: Secondary | ICD-10-CM | POA: Insufficient documentation

## 2016-07-24 DIAGNOSIS — Y929 Unspecified place or not applicable: Secondary | ICD-10-CM | POA: Diagnosis not present

## 2016-07-24 DIAGNOSIS — I10 Essential (primary) hypertension: Secondary | ICD-10-CM | POA: Insufficient documentation

## 2016-07-24 DIAGNOSIS — Y999 Unspecified external cause status: Secondary | ICD-10-CM | POA: Insufficient documentation

## 2016-07-24 DIAGNOSIS — Z79899 Other long term (current) drug therapy: Secondary | ICD-10-CM | POA: Insufficient documentation

## 2016-07-24 MED ORDER — OXYCODONE-ACETAMINOPHEN 5-325 MG PO TABS
1.0000 | ORAL_TABLET | Freq: Once | ORAL | Status: AC
  Administered 2016-07-24: 1 via ORAL

## 2016-07-24 MED ORDER — OXYCODONE-ACETAMINOPHEN 5-325 MG PO TABS
ORAL_TABLET | ORAL | Status: AC
  Filled 2016-07-24: qty 1

## 2016-07-24 MED ORDER — OXYCODONE-ACETAMINOPHEN 5-325 MG PO TABS: 1.0000 | ORAL_TABLET | Freq: Four times a day (QID) | ORAL | 0 refills | Status: AC | PRN

## 2016-07-24 NOTE — ED Notes (Signed)
X-ray at bedside

## 2016-07-24 NOTE — ED Provider Notes (Signed)
[facility] Emergency Department Provider Note  ____________________________________________  Time seen: Approximately 5:55 PM  I have reviewed the triage vital signs and the nursing notes.   HISTORY  Chief Complaint Fall    HPI Brandi Perry is a 75 y.o. female presenting to the emergency department with left wrist and left ankle pain. Patient states that she fell descending down stairs. Patient states that she did not hit her head or lose consciousness. She denies nausea or vomiting. She denies changes in vision. Patient states that her left wrist pain is 9 out of 10 in intensity with movement. She has not attempted alleviating measures. She has no prior surgeries or traumas to the left wrist. No acute weakness or radiculopathy since the incident. Patient is right handed. Patient's immunization status is updated. Patient has had bilateral lower extremity swelling prior to fall today.   Past Medical History:  Diagnosis Date  . Hypertension     There are no active problems to display for this patient.   Past Surgical History:  Procedure Laterality Date  . ABDOMINAL HYSTERECTOMY    . APPENDECTOMY    . CHOLECYSTECTOMY    . TONSILLECTOMY      Prior to Admission medications   Medication Sig Start Date End Date Taking? Authorizing Provider  albuterol (PROVENTIL HFA;VENTOLIN HFA) 108 (90 BASE) MCG/ACT inhaler Inhale 1-2 puffs into the lungs every 4 (four) hours as needed for wheezing or shortness of breath. 06/09/15   Barbaraann Barthel, NP  amLODipine (NORVASC) 5 MG tablet Take 5 mg by mouth daily.    Historical Provider, MD  citalopram (CELEXA) 10 MG tablet Take 10 mg by mouth daily.    Historical Provider, MD  doxycycline (VIBRAMYCIN) 100 MG capsule Take 1 capsule (100 mg total) by mouth 2 (two) times daily. 06/09/15   Barbaraann Barthel, NP  fluticasone (FLONASE) 50 MCG/ACT nasal spray Place 1 spray into both nostrils 2 (two) times daily. 06/09/15   Barbaraann Barthel, NP  losartan (COZAAR) 25 MG tablet Take 25 mg by mouth daily.    Historical Provider, MD  Metoprolol Succinate (TOPROL XL PO) Take 25 mg by mouth daily.    Historical Provider, MD  montelukast (SINGULAIR) 10 MG tablet Take 1 tablet (10 mg total) by mouth at bedtime. 06/09/15   Barbaraann Barthel, NP  Multiple Vitamin (MULTIVITAMIN) tablet Take 1 tablet by mouth daily.    Historical Provider, MD  oxyCODONE-acetaminophen (ROXICET) 5-325 MG tablet Take 1 tablet by mouth every 6 (six) hours as needed. 07/24/16 07/26/16  Orvil Feil, PA-C  sodium chloride (OCEAN) 0.65 % SOLN nasal spray Place 2 sprays into both nostrils every 2 (two) hours while awake. 06/09/15   Barbaraann Barthel, NP    Allergies Penicillins  History reviewed. No pertinent family history.  Social History Social History  Substance Use Topics  . Smoking status: Former Games developer  . Smokeless tobacco: Never Used  . Alcohol use No     Review of Systems  Constitutional: No fever/chills Eyes: No visual changes.  ENT: No upper respiratory complaints. Cardiovascular: no chest pain. Respiratory: no cough. No SOB. Gastrointestinal: No abdominal pain.  No nausea, no vomiting.  No diarrhea.  No constipation. Genitourinary: Negative for dysuria. No hematuria Musculoskeletal: Has left wrist and left ankle pain. Skin: She has abrasions localized to her left lower extremity and right upper extremity. Neurological: Negative for headaches, focal weakness or numbness. 10-point ROS otherwise negative.  ____________________________________________   PHYSICAL  EXAM:  VITAL SIGNS: ED Triage Vitals [07/24/16 1659]  Enc Vitals Group     BP (!) 195/73     Pulse Rate (!) 59     Resp 20     Temp 98.1 F (36.7 C)     Temp Source Oral     SpO2 94 %     Weight 200 lb (90.7 kg)     Height      Head Circumference      Peak Flow      Pain Score 8     Pain Loc      Pain Edu?      Excl. in GC?      Constitutional:  Alert and oriented. Well appearing and in no acute distress. Eyes: Conjunctivae are normal. PERRL. EOMI. Head: Atraumatic. Neck: No stridor. FROM.  Cardiovascular: Normal rate, regular rhythm. Normal S1 and S2.  Good peripheral circulation. Respiratory: Normal respiratory effort without tachypnea or retractions. Lungs CTAB. Good air entry to the bases with no decreased or absent breath sounds. Musculoskeletal: Patient has full range of motion of the right upper extremity. Patient has full range of motion at the left shoulder and left elbow. She is unable to perform flexion and extension at the wrist left. Patient is able to move all 5 fingers, left. Palpable radial and ulnar pulses bilaterally.   Patient is able to perform dorsi flexion and plantar flexion, left. 1+ pitting edema, left lower extremity. 2+ pitting edema, right lower extremity. Palpable dorsalis pedis pulse, left.   Reflexes are 2+ and symmetric in the upper and lower extremities bilaterally.  Neurologic:  Normal speech and language. No gross focal neurologic deficits are appreciated.  Skin:  She has superficial abrasions at the left lower extremity and right upper extremity. Psychiatric: Mood and affect are normal. Speech and behavior are normal. Patient exhibits appropriate insight and judgement.   ____________________________________________   LABS (all labs ordered are listed, but only abnormal results are displayed)  Labs Reviewed - No data to display ____________________________________________  EKG   ____________________________________________  RADIOLOGY Geraldo PitterI, Trinisha Paget M Breia Ocampo, personally viewed and evaluated these images (plain radiographs) as part of my medical decision making, as well as reviewing the written report by the radiologist.  Dg Wrist Complete Left  Result Date: 07/24/2016 CLINICAL DATA:  Fall.  Left wrist bruising, swelling, pain EXAM: LEFT WRIST - COMPLETE 3+ VIEW COMPARISON:  None. FINDINGS:  Distal left radial fracture noted with mild posterior displacement and angulation. Ulnar styloid fracture also noted. Overlying soft tissue swelling appear IMPRESSION: Mildly displaced and angulated distal left radial fracture. Ulnar styloid fracture. Electronically Signed   By: Charlett NoseKevin  Dover M.D.   On: 07/24/2016 18:33   Dg Ankle Complete Left  Result Date: 07/24/2016 CLINICAL DATA:  All, ankle pain and swelling. EXAM: LEFT ANKLE COMPLETE - 3+ VIEW COMPARISON:  None. FINDINGS: Diffuse soft tissue swelling. No fracture, subluxation or dislocation. IMPRESSION: No acute bony abnormality. Electronically Signed   By: Charlett NoseKevin  Dover M.D.   On: 07/24/2016 18:34    ____________________________________________    PROCEDURES  Procedure(s) performed:    Procedures    Medications  oxyCODONE-acetaminophen (PERCOCET/ROXICET) 5-325 MG per tablet 1 tablet (1 tablet Oral Given 07/24/16 1757)     ____________________________________________   INITIAL IMPRESSION / ASSESSMENT AND PLAN / ED COURSE  Pertinent labs & imaging results that were available during my care of the patient were reviewed by me and considered in my medical decision making (see chart for details).  Review of the [place] CSRS was performed in accordance of the [facility] prior to dispensing any controlled drugs.  Clinical Course     Assessment and plan: Patient presents with left wrist pain after falling this evening.  DG wrist complete indicates a minimally displaced distal radius fracture and ulnar styloid fracture. Skin overlying fractures is warm with palpable pulses. A volar wrist splint was applied in the emergency department tonight. Patient was advised to follow up with orthopedics tomorrow. A 2 day course of Percocet was prescribed at discharge for pain. All patient questions were answered. ____________________________________________  FINAL CLINICAL IMPRESSION(S) / ED DIAGNOSES  Final diagnoses:  Closed fracture of distal  end of left radius, unspecified fracture morphology, initial encounter      NEW MEDICATIONS STARTED DURING THIS VISIT:  New Prescriptions   OXYCODONE-ACETAMINOPHEN (ROXICET) 5-325 MG TABLET    Take 1 tablet by mouth every 6 (six) hours as needed.        This chart was dictated using voice recognition software/Dragon. Despite best efforts to proofread, errors can occur which can change the meaning. Any change was purely unintentional.    Orvil FeilJaclyn M Kerie Badger, PA-C 07/24/16 1922    Loleta Roseory Forbach, MD 07/25/16 0000

## 2016-07-24 NOTE — ED Triage Notes (Addendum)
Pt to ed with c/o fall today.  Pt states she tripped on step and fell forward, now with pain to left wrist and left leg. Denies loss of consciousness.  Left wrist with swelling and increased pain with movement.  +pulse, movement, sensation.

## 2016-07-24 NOTE — ED Notes (Signed)
Cleaned up patients leg scratches and applied band-aids to areas per patients request

## 2016-07-24 NOTE — ED Notes (Signed)
Pt states she fell going down stairs, landed on grass. L leg pain and L wrist pain. L wrist swollen, denies wanting ice pack, able to move fingers. L leg has abrasions noted to L knee and L shin. Denies LOC.

## 2018-08-30 ENCOUNTER — Other Ambulatory Visit: Payer: Self-pay

## 2018-08-30 ENCOUNTER — Emergency Department: Payer: Medicare HMO

## 2018-08-30 ENCOUNTER — Encounter: Payer: Self-pay | Admitting: Intensive Care

## 2018-08-30 ENCOUNTER — Emergency Department
Admission: EM | Admit: 2018-08-30 | Discharge: 2018-08-30 | Disposition: A | Discharge status: Home / Self Care | Payer: Medicare HMO | Attending: Emergency Medicine | Admitting: Emergency Medicine

## 2018-08-30 DIAGNOSIS — J069 Acute upper respiratory infection, unspecified: Secondary | ICD-10-CM | POA: Insufficient documentation

## 2018-08-30 DIAGNOSIS — Z87891 Personal history of nicotine dependence: Secondary | ICD-10-CM | POA: Diagnosis not present

## 2018-08-30 DIAGNOSIS — R05 Cough: Secondary | ICD-10-CM | POA: Diagnosis present

## 2018-08-30 DIAGNOSIS — I10 Essential (primary) hypertension: Secondary | ICD-10-CM | POA: Diagnosis not present

## 2018-08-30 DIAGNOSIS — R110 Nausea: Secondary | ICD-10-CM | POA: Insufficient documentation

## 2018-08-30 LAB — BASIC METABOLIC PANEL
ANION GAP: 4 — AB (ref 5–15)
BUN: 19 mg/dL (ref 8–23)
CHLORIDE: 103 mmol/L (ref 98–111)
CO2: 31 mmol/L (ref 22–32)
Calcium: 8.8 mg/dL — ABNORMAL LOW (ref 8.9–10.3)
Creatinine, Ser: 0.73 mg/dL (ref 0.44–1.00)
GLUCOSE: 160 mg/dL — AB (ref 70–99)
POTASSIUM: 3.4 mmol/L — AB (ref 3.5–5.1)
Sodium: 138 mmol/L (ref 135–145)

## 2018-08-30 LAB — TROPONIN I

## 2018-08-30 LAB — CBC
HEMATOCRIT: 45.6 % (ref 36.0–46.0)
Hemoglobin: 15.1 g/dL — ABNORMAL HIGH (ref 12.0–15.0)
MCH: 30.3 pg (ref 26.0–34.0)
MCHC: 33.1 g/dL (ref 30.0–36.0)
MCV: 91.4 fL (ref 80.0–100.0)
Platelets: 266 10*3/uL (ref 150–400)
RBC: 4.99 MIL/uL (ref 3.87–5.11)
RDW: 13 % (ref 11.5–15.5)
WBC: 13 10*3/uL — AB (ref 4.0–10.5)
nRBC: 0 % (ref 0.0–0.2)

## 2018-08-30 LAB — LACTIC ACID, PLASMA: LACTIC ACID, VENOUS: 1.1 mmol/L (ref 0.5–1.9)

## 2018-08-30 LAB — INFLUENZA PANEL BY PCR (TYPE A & B)
INFLBPCR: NEGATIVE
Influenza A By PCR: NEGATIVE

## 2018-08-30 MED ORDER — ALBUTEROL SULFATE HFA 108 (90 BASE) MCG/ACT IN AERS: 2.0000 | INHALATION_SPRAY | Freq: Four times a day (QID) | RESPIRATORY_TRACT | 0 refills | Status: AC | PRN

## 2018-08-30 MED ORDER — BENZONATATE 100 MG PO CAPS: 100.0000 mg | ORAL_CAPSULE | Freq: Four times a day (QID) | ORAL | 0 refills | Status: AC | PRN

## 2018-08-30 MED ORDER — ONDANSETRON 4 MG PO TBDP
4.0000 mg | ORAL_TABLET | Freq: Once | ORAL | Status: AC
  Administered 2018-08-30: 4 mg via ORAL
  Filled 2018-08-30: qty 1

## 2018-08-30 MED ORDER — IPRATROPIUM-ALBUTEROL 0.5-2.5 (3) MG/3ML IN SOLN
3.0000 mL | Freq: Once | RESPIRATORY_TRACT | Status: AC
  Administered 2018-08-30: 3 mL via RESPIRATORY_TRACT
  Filled 2018-08-30: qty 3

## 2018-08-30 MED ORDER — ONDANSETRON 4 MG PO TBDP: 4.0000 mg | ORAL_TABLET | Freq: Three times a day (TID) | ORAL | 0 refills | Status: AC | PRN

## 2018-08-30 NOTE — ED Triage Notes (Signed)
Patient presents with productive cough and SOB. Prescribed antibiotics last week with no relief.

## 2018-08-30 NOTE — ED Provider Notes (Signed)
[facility] Emergency Department Provider Note  ____________________________________________  Time seen: Approximately 9:00 PM  I have reviewed the triage vital signs and the nursing notes.   HISTORY  Chief Complaint Cough    HPI SHEKITA GOOGE is a 78 y.o. female with a history of HTN presenting with cough, congestion and rhinorrhea, general malaise, nausea without vomiting.  The patient reports that since Tuesday, she has been feeling poorly.  On Wednesday, she was seen by her PMD who placed her on prednisone and Keflex, but she feels her symptoms have not gotten better and today she felt worse.  She has had nausea without vomiting; no abdominal pain or diarrhea.  No fevers or chills.  No travel outside [place] or sick contacts.  Past Medical History:  Diagnosis Date  . Hypertension     There are no active problems to display for this patient.   Past Surgical History:  Procedure Laterality Date  . ABDOMINAL HYSTERECTOMY    . APPENDECTOMY    . CHOLECYSTECTOMY    . TONSILLECTOMY      Current Outpatient Rx  . Order #: [id number] Class: Print  . Order #: [id number] Class: Historical Med  . Order #: [id number] Class: Print  . Order #: [id number] Class: Historical Med  . Order #: [id number] Class: Print  . Order #: [id number] Class: Normal  . Order #: [id number] Class: Historical Med  . Order #: [id number] Class: Historical Med  . Order #: [id number] Class: Print  . Order #: [id number] Class: Historical Med  . Order #: [id number] Class: Print  . Order #: [id number] Class: OTC    Allergies Penicillins and Red dye  History reviewed. No pertinent family history.  Social History Social History   Tobacco Use  . Smoking status: Former Games developer  . Smokeless tobacco: Never Used  Substance Use Topics  . Alcohol use: No  . Drug use: No    Review of Systems Constitutional: No fever/chills.  No lightheadedness or syncope. Eyes: No visual changes. ENT: No sore  throat.  Positive congestion and rhinorrhea. Cardiovascular: Denies chest pain. Denies palpitations. Respiratory: Denies shortness of breath.  Positive cough. Gastrointestinal: No abdominal pain.  No nausea, no vomiting.  No diarrhea.  No constipation. Genitourinary: Negative for dysuria. Musculoskeletal: Negative for back pain.  No lower extremity swelling or calf pain. Skin: Negative for rash. Neurological: Negative for headaches. No focal numbness, tingling or weakness.     ____________________________________________   PHYSICAL EXAM:  VITAL SIGNS: ED Triage Vitals  Enc Vitals Group     BP 08/30/18 1200 (!) 182/76     Pulse Rate 08/30/18 1200 81     Resp 08/30/18 1200 18     Temp 08/30/18 1200 99.5 F (37.5 C)     Temp Source 08/30/18 1200 Oral     SpO2 08/30/18 1200 91 %     Weight 08/30/18 1200 189 lb (85.7 kg)     Height 08/30/18 1200 5\' 2"  (1.575 m)     Head Circumference --      Peak Flow --      Pain Score 08/30/18 1206 0     Pain Loc --      Pain Edu? --      Excl. in GC? --     Constitutional: Alert and oriented. Answers questions appropriately. Eyes: Conjunctivae are normal.  EOMI. No scleral icterus.  No eye discharge. Head: Atraumatic. Nose: No congestion/rhinnorhea on my exam. Mouth/Throat: Mucous membranes are moist.  No posterior pharyngeal erythema, tonsillar swelling or exudate.  The posterior  palate is symmetric and the uvula is midline.  No stridor, drooling, trismus or hoarse voice Neck: No stridor.  Supple.  No JVD.  No meningismus. Cardiovascular: Normal rate, regular rhythm. No murmurs, rubs or gallops.  Respiratory: Normal respiratory effort.  No accessory muscle use or retractions. Lungs CTAB.  Mild end expiratory wheezing without rales or rhonchi. Gastrointestinal: Soft, nontender and nondistended.  No guarding or rebound.  No peritoneal signs. Musculoskeletal: No LE edema. No ttp in the calves or palpable cords.  Negative Homan's  sign. Neurologic:  A&Ox3.  Speech is clear.  Face and smile are symmetric.  EOMI.  Moves all extremities well. Skin:  Skin is warm, dry and intact. No rash noted. Psychiatric: Mood and affect are normal. Speech and behavior are normal.  Normal judgement.  ____________________________________________   LABS (all labs ordered are listed, but only abnormal results are displayed)  Labs Reviewed  BASIC METABOLIC PANEL - Abnormal; Notable for the following components:      Result Value   Potassium 3.4 (*)    Glucose, Bld 160 (*)    Calcium 8.8 (*)    Anion gap 4 (*)    All other components within normal limits  CBC - Abnormal; Notable for the following components:   WBC 13.0 (*)    Hemoglobin 15.1 (*)    All other components within normal limits  TROPONIN I  LACTIC ACID, PLASMA  INFLUENZA PANEL BY PCR (TYPE A & B)   ____________________________________________  EKG  ED ECG REPORT I, Anne-Caroline Sharma Covert, the attending physician, personally viewed and interpreted this ECG.   Date: 08/30/2018  EKG Time: 1212  Rate: 70  Rhythm: normal sinus rhythm  Axis: leftward  Intervals:none  ST&T Change: No STEMI  ____________________________________________  RADIOLOGY  Dg Chest 2 View  Result Date: 08/30/2018 CLINICAL DATA:  Patient reports worsening SOB for past 5 days. Denies CP, cough or fever. Denies any known cardiopulmonary conditions. Former smoker. EXAM: CHEST - 2 VIEW COMPARISON:  09/06/2014 FINDINGS: Cardiac silhouette is normal in size. No mediastinal or hilar masses. No evidence of adenopathy. Chronically prominent bronchovascular markings. No evidence of pneumonia or pulmonary edema. No pleural effusion or pneumothorax. Skeletal structures are demineralized but intact. IMPRESSION: No acute cardiopulmonary disease. Electronically Signed   By: Amie Portland M.D.   On: 08/30/2018 14:31    ____________________________________________   PROCEDURES  Procedure(s) performed:  None  Procedures  Critical Care performed: No ____________________________________________   INITIAL IMPRESSION / ASSESSMENT AND PLAN / ED COURSE  Pertinent labs & imaging results that were available during my care of the patient were reviewed by me and considered in my medical decision making (see chart for details).  78 y.o. female with 6 days of cough, congestion and rhinorrhea, general malaise and nausea without vomiting.  The patient was hypertensive but has not had her medications today.  She has O2 sats between 91 and 94% on room air.  Her chest x-ray does not show a pneumonia.  It is likely that she has a viral URI, or an influenza-like illness.  Her influenza test here today is negative.  We will plan to give her a DuoNeb for her wheezing, and Zofran for her nausea.  I have given her expectant management for a URI.  She will be discharged home with [facility] and an albuterol MDI.  On my exam, the patient was able to maintain oxygen saturations with ambulation.  Her vital signs show 1 reading of oxygenation 89% which I  never saw when I examined her; this may have been a poor tracing, although I will did not see it when it was recorded.  Was able to ambulate without any difficulty upon discharge.  Up instructions as well as return precautions were discussed  ____________________________________________  FINAL CLINICAL IMPRESSION(S) / ED DIAGNOSES  Final diagnoses:  Upper respiratory tract infection, unspecified type  Nausea without vomiting         NEW MEDICATIONS STARTED DURING THIS VISIT:  New Prescriptions   ALBUTEROL (PROVENTIL HFA;VENTOLIN HFA) 108 (90 BASE) MCG/ACT INHALER    Inhale 2 puffs into the lungs every 6 (six) hours as needed for wheezing or shortness of breath.   BENZONATATE (TESSALON PERLES) 100 MG CAPSULE    Take 1 capsule (100 mg total) by mouth every 6 (six) hours as needed for cough.   ONDANSETRON (ZOFRAN ODT) 4 MG DISINTEGRATING TABLET    Take 1  tablet (4 mg total) by mouth every 8 (eight) hours as needed for nausea or vomiting.      Rockne MenghiniNorman, Anne-Caroline, MD 08/31/18 [phone]

## 2018-08-30 NOTE — Discharge Instructions (Addendum)
Please to new to drink plenty of fluids to stay well-hydrated.  For your cough, you may take the albuterol inhaler and Tessalon Perles.  If you develop nausea, you may take Zofran.  Please wash your hands frequently and thoroughly to prevent the spread of infection.  Return to the emergency department if you develop severe pain, lightheadedness or fainting, fever that does not go away with Tylenol or Motrin, shortness of breath, inability to keep down fluids, or any other symptoms concerning to you.

## 2018-09-02 ENCOUNTER — Ambulatory Visit (INDEPENDENT_AMBULATORY_CARE_PROVIDER_SITE_OTHER): Payer: Medicare HMO

## 2018-09-02 ENCOUNTER — Encounter: Payer: Self-pay | Admitting: Emergency Medicine

## 2018-09-02 ENCOUNTER — Ambulatory Visit
Admission: EM | Admit: 2018-09-02 | Discharge: 2018-09-02 | Disposition: A | Discharge status: Home / Self Care | Payer: Medicare HMO | Attending: Family Medicine | Admitting: Family Medicine

## 2018-09-02 DIAGNOSIS — R062 Wheezing: Secondary | ICD-10-CM

## 2018-09-02 DIAGNOSIS — J9801 Acute bronchospasm: Secondary | ICD-10-CM

## 2018-09-02 DIAGNOSIS — R059 Cough, unspecified: Secondary | ICD-10-CM

## 2018-09-02 DIAGNOSIS — R05 Cough: Secondary | ICD-10-CM

## 2018-09-02 DIAGNOSIS — R0902 Hypoxemia: Secondary | ICD-10-CM

## 2018-09-02 MED ORDER — IPRATROPIUM-ALBUTEROL 0.5-2.5 (3) MG/3ML IN SOLN
3.0000 mL | Freq: Once | RESPIRATORY_TRACT | Status: AC
  Administered 2018-09-02: 3 mL via RESPIRATORY_TRACT

## 2018-09-02 NOTE — ED Triage Notes (Signed)
Patient states she has had a cough for approximately 12 days.   States she just cant shake the cough and wheezing

## 2018-09-02 NOTE — ED Provider Notes (Signed)
[facility]    CSN: [id number] Arrival date & time: 09/02/18  1454     History   Chief Complaint Chief Complaint  Patient presents with  . Cough    HPI Brandi Perry is a 78 y.o. female.   The history is provided by the patient.  Cough  Associated symptoms: wheezing   URI  Presenting symptoms: cough and fatigue   Severity:  Moderate Onset quality:  Sudden Duration:  12 days Timing:  Constant Progression:  Worsening Chronicity:  New Relieved by:  Nothing Ineffective treatments:  Prescription medications and OTC medications Associated symptoms: wheezing   Risk factors: being elderly and sick contacts  Chronic respiratory disease: unknown, however former smoker.     Past Medical History:  Diagnosis Date  . Hypertension     There are no active problems to display for this patient.   Past Surgical History:  Procedure Laterality Date  . ABDOMINAL HYSTERECTOMY    . APPENDECTOMY    . CHOLECYSTECTOMY    . TONSILLECTOMY      OB History   No obstetric history on file.      Home Medications    Prior to Admission medications   Medication Sig Start Date End Date Taking? Authorizing Provider  albuterol (PROVENTIL HFA;VENTOLIN HFA) 108 (90 Base) MCG/ACT inhaler Inhale 2 puffs into the lungs every 6 (six) hours as needed for wheezing or shortness of breath. 08/30/18  Yes Rockne Menghini, MD  amLODipine (NORVASC) 5 MG tablet Take 5 mg by mouth daily.   Yes [provider]  benzonatate (TESSALON PERLES) 100 MG capsule Take 1 capsule (100 mg total) by mouth every 6 (six) hours as needed for cough. 08/30/18  Yes Rockne Menghini, MD  citalopram (CELEXA) 10 MG tablet Take 10 mg by mouth daily.   Yes [provider]  Metoprolol Succinate (TOPROL XL PO) Take 25 mg by mouth daily.   Yes [provider]  doxycycline (VIBRAMYCIN) 100 MG capsule Take 1 capsule (100 mg total) by mouth 2 (two) times daily. 06/09/15   Betancourt,  Jarold Song, NP  fluticasone (FLONASE) 50 MCG/ACT nasal spray Place 1 spray into both nostrils 2 (two) times daily. 06/09/15   Betancourt, Jarold Song, NP  losartan (COZAAR) 25 MG tablet Take 25 mg by mouth daily.    [provider]  montelukast (SINGULAIR) 10 MG tablet Take 1 tablet (10 mg total) by mouth at bedtime. 06/09/15   Betancourt, Jarold Song, NP  Multiple Vitamin (MULTIVITAMIN) tablet Take 1 tablet by mouth daily.    [provider]  ondansetron (ZOFRAN ODT) 4 MG disintegrating tablet Take 1 tablet (4 mg total) by mouth every 8 (eight) hours as needed for nausea or vomiting. 08/30/18   Rockne Menghini, MD  sodium chloride (OCEAN) 0.65 % SOLN nasal spray Place 2 sprays into both nostrils every 2 (two) hours while awake. 06/09/15   Betancourt, Jarold Song, NP    Family History Family History  Problem Relation Age of Onset  . Cancer Mother   . Diabetes Mother   . Stroke Father   . Diabetes Father   . Cancer Father     Social History Social History   Tobacco Use  . Smoking status: Former Games developer  . Smokeless tobacco: Never Used  Substance Use Topics  . Alcohol use: No  . Drug use: No     Allergies   Penicillins and Red dye   Review of Systems Review of Systems  Constitutional: Positive for  fatigue.  Respiratory: Positive for cough and wheezing.      Physical Exam Triage Vital Signs ED Triage Vitals  Enc Vitals Group     BP 09/02/18 1621 (!) 160/70     Pulse Rate 09/02/18 1621 86     Resp 09/02/18 1621 (!) 22     Temp 09/02/18 1621 (!) 100.7 F (38.2 C)     Temp Source 09/02/18 1621 Oral     SpO2 09/02/18 1618 92 %     Weight 09/02/18 1618 189 lb (85.7 kg)     Height 09/02/18 1618 5\' 2"  (1.575 m)     Head Circumference --      Peak Flow --      Pain Score 09/02/18 1617 5     Pain Loc --      Pain Edu? --      Excl. in GC? --    No data found.  Updated Vital Signs BP (!) 160/70 (BP Location: Left Arm)   Pulse 99   Temp (!) 100.7 F (38.2 C)  (Oral)   Resp (!) 22   Ht 5\' 2"  (1.575 m)   Wt 85.7 kg   SpO2 91%   BMI 34.57 kg/m   Visual Acuity Right Eye Distance:   Left Eye Distance:   Bilateral Distance:    Right Eye Near:   Left Eye Near:    Bilateral Near:     Physical Exam Vitals signs and nursing note reviewed.  Constitutional:      General: She is not in acute distress.    Appearance: She is not ill-appearing, toxic-appearing or diaphoretic.  Cardiovascular:     Rate and Rhythm: Normal rate and regular rhythm.     Heart sounds: Normal heart sounds.  Pulmonary:     Effort: Pulmonary effort is normal. No respiratory distress.     Breath sounds: No stridor. Wheezing and rhonchi present. No rales.  Neurological:     Mental Status: She is alert.      UC Treatments / Results  Labs (all labs ordered are listed, but only abnormal results are displayed) Labs Reviewed - No data to display  EKG None  Radiology Dg Chest 2 View  Result Date: 09/02/2018 CLINICAL DATA:  Cough for 12 days. Fever. Wheezing. EXAM: CHEST - 2 VIEW COMPARISON:  08/30/2018. FINDINGS: Cardiomegaly. Calcified tortuous aorta. Improved aeration compared with priors. Mild interstitial prominence of a chronic nature, but no pneumonia or pulmonary edema. Skeletal osteopenia. IMPRESSION: No active cardiopulmonary disease. Chronic interstitial prominence, but no consolidation edema. Improved aeration from priors. Electronically Signed   By: Elsie StainJohn T Curnes M.D.   On: 09/02/2018 17:45    Procedures Procedures (including critical care time)  Medications Ordered in UC Medications  ipratropium-albuterol (DUONEB) 0.5-2.5 (3) MG/3ML nebulizer solution 3 mL (3 mLs Nebulization Given 09/02/18 1722)  ipratropium-albuterol (DUONEB) 0.5-2.5 (3) MG/3ML nebulizer solution 3 mL (3 mLs Nebulization Given 09/02/18 1811)    Initial Impression / Assessment and Plan / UC Course  I have reviewed the triage vital signs and the nursing notes.  Pertinent labs & imaging  results that were available during my care of the patient were reviewed by me and considered in my medical decision making (see chart for details).      Final Clinical Impressions(s) / UC Diagnoses   Final diagnoses:  Wheezing  Bronchospasm  Cough  Hypoxia    ED Prescriptions    None     1.x-ray results and diagnosis reviewed with patient;  patient given duoneb x 2 with minimal improvement; recommend patient go to Emergency Department for further evaluation and management.   Controlled Substance Prescriptions [place] Controlled Substance Registry consulted? Not Applicable   Payton Mccallum, MD 09/02/18 [phone]

## 2018-09-02 NOTE — ED Triage Notes (Signed)
Walking pulse ox dropped to 88 while walking but after she sat down it had dropped to 86

## 2018-09-02 NOTE — Discharge Instructions (Signed)
Recommend patient go to Emergency Department for further evaluation and managment 

## 2018-09-14 ENCOUNTER — Emergency Department: Admission: EM | Admit: 2018-09-14 | Discharge: 2018-09-14 | Discharge status: Absent without leave | Payer: Self-pay

## 2018-10-10 IMAGING — DX DG ANKLE COMPLETE 3+V*L*
3 series · 3 of 3 positions shown · non-contrast
Comparison: None.

CLINICAL DATA: All, ankle pain and swelling.

EXAM:
LEFT ANKLE COMPLETE - 3+ VIEW

[ankle ap]
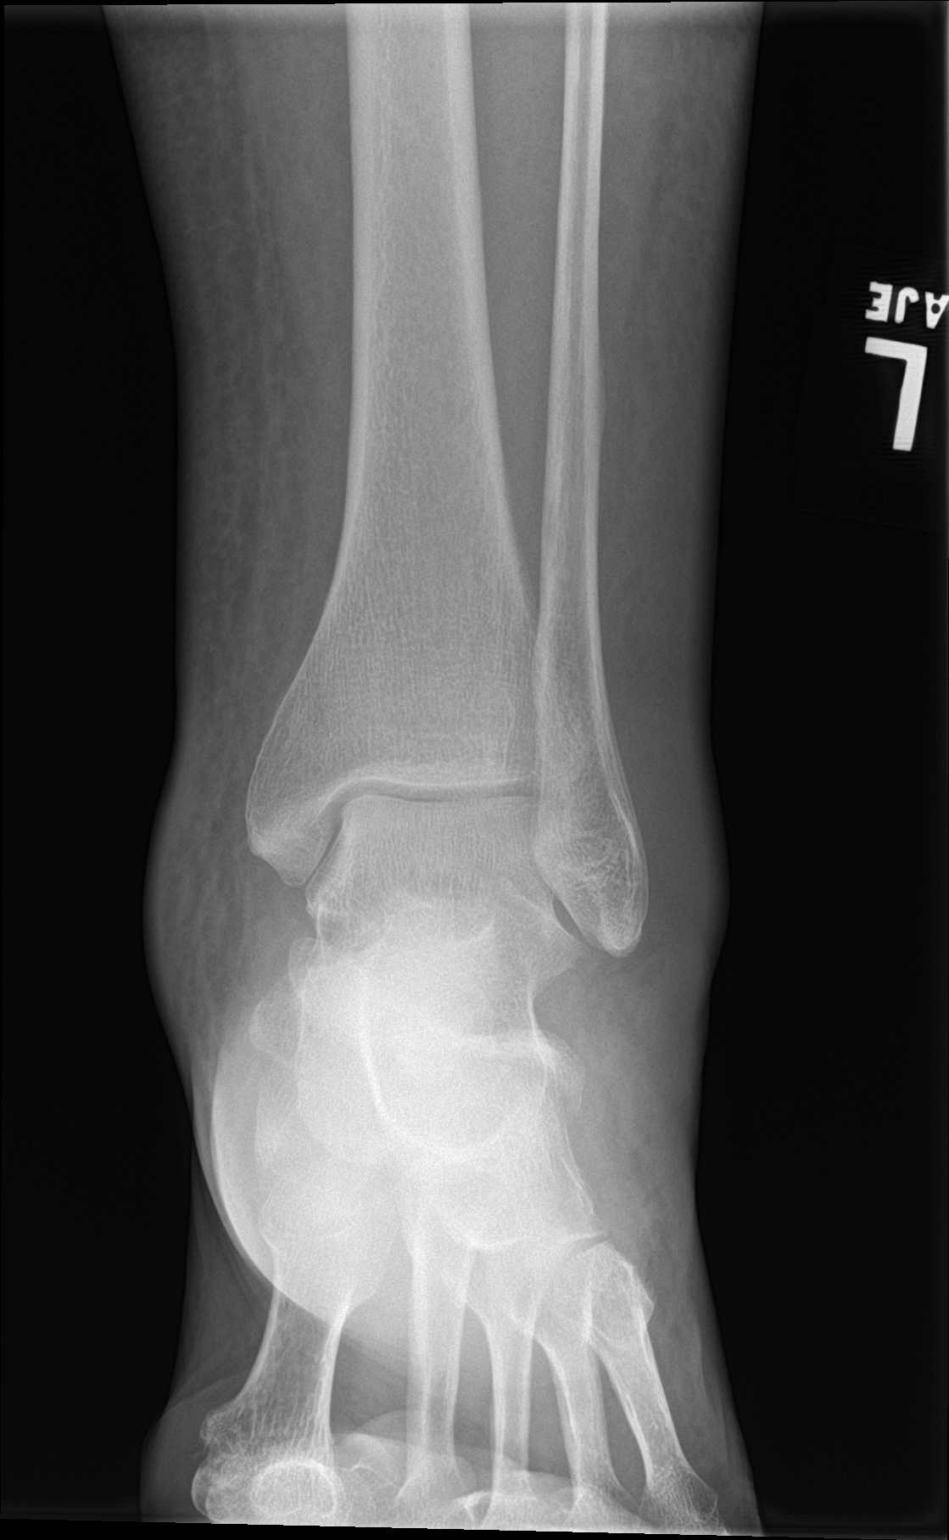

[ankle obl]
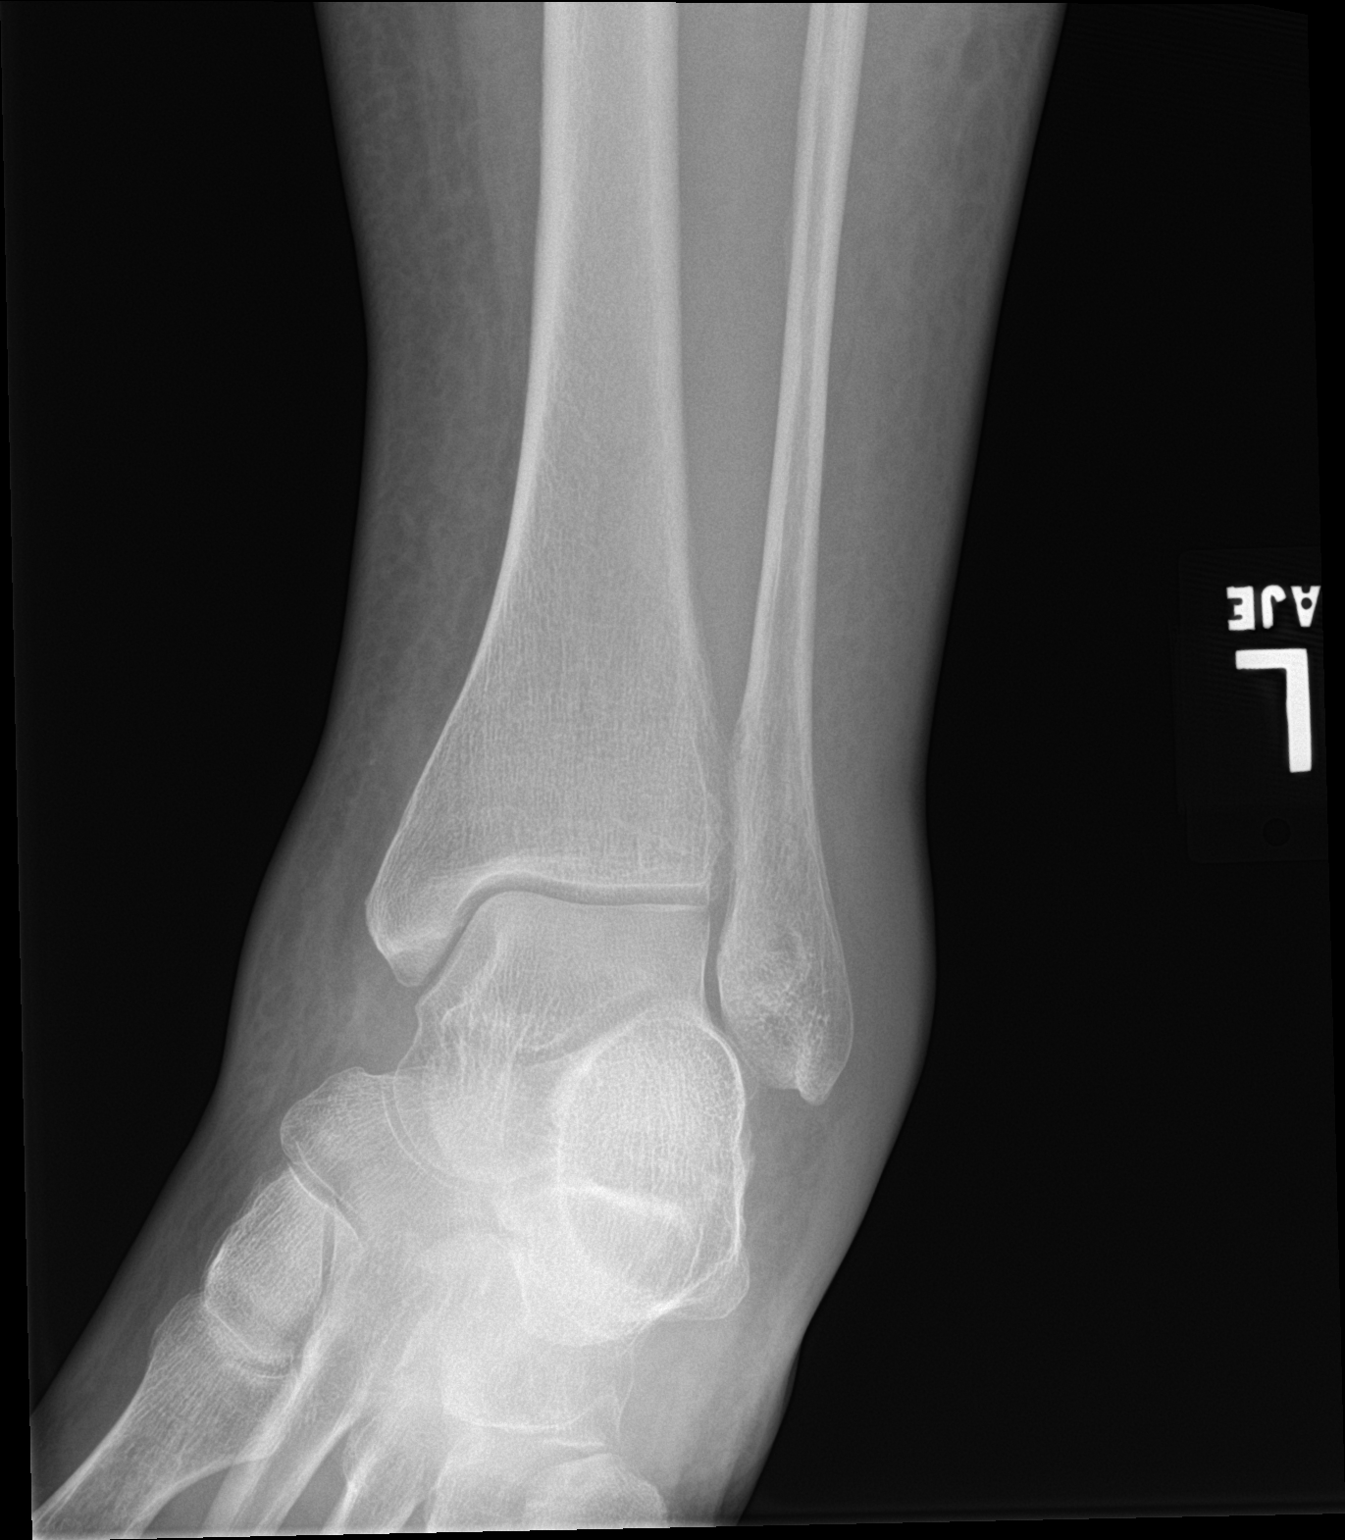

[ankle lat]
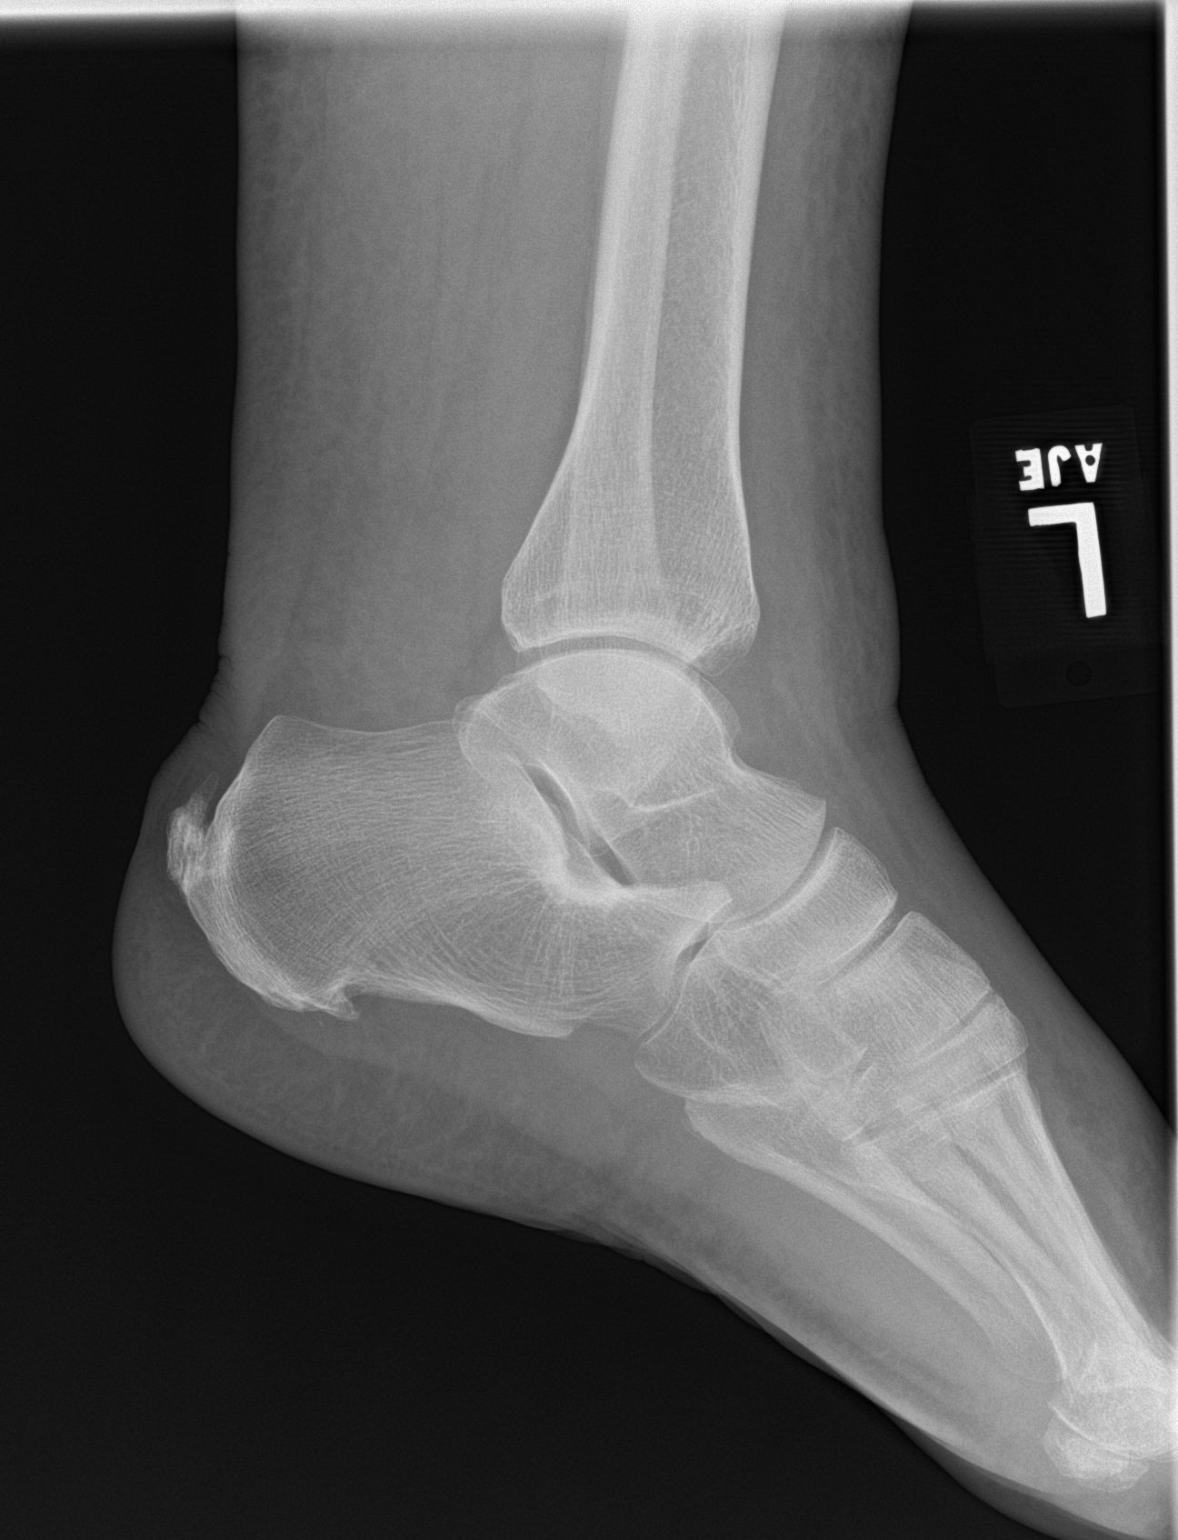

[3 of 3 positions shown; findings below may reference images not displayed]

FINDINGS: Diffuse soft tissue swelling. No fracture, subluxation or
dislocation.
IMPRESSION: No acute bony abnormality.

## 2019-09-20 ENCOUNTER — Ambulatory Visit: Payer: Medicare HMO | Attending: Internal Medicine

## 2019-09-20 DIAGNOSIS — Z23 Encounter for immunization: Secondary | ICD-10-CM | POA: Insufficient documentation

## 2019-09-20 NOTE — Progress Notes (Signed)
   Covid-19 Vaccination Clinic  Name:  Brandi Perry    MRN: [id number] DOB: 01-04-1941  09/20/2019  Brandi Perry was observed post Covid-19 immunization for 15 minutes without incidence. She was provided with Vaccine Information Sheet and instruction to access the V-Safe system.   Brandi Perry was instructed to call 911 with any severe reactions post vaccine: Marland Kitchen Difficulty breathing  . Swelling of your face and throat  . A fast heartbeat  . A bad rash all over your body  . Dizziness and weakness    Immunizations Administered    Name Date Dose VIS Date Route   Pfizer COVID-19 Vaccine 09/20/2019 10:57 AM 0.3 mL 07/09/2019 Intramuscular   Manufacturer: [employer], [employer]   Lot: [id number]   NDC: [address]-2

## 2019-10-12 ENCOUNTER — Ambulatory Visit: Payer: Medicare HMO | Attending: Internal Medicine

## 2019-10-12 DIAGNOSIS — Z23 Encounter for immunization: Secondary | ICD-10-CM

## 2019-10-12 NOTE — Progress Notes (Signed)
   Covid-19 Vaccination Clinic  Name:  Brandi Perry    MRN: [id number] DOB: 03-Nov-1940  10/12/2019  [name] was observed post Covid-19 immunization for 15 minutes without incident. She was provided with Vaccine Information Sheet and instruction to access the V-Safe system.   [name] was instructed to call 911 with any severe reactions post vaccine: Marland Kitchen Difficulty breathing  . Swelling of face and throat  . A fast heartbeat  . A bad rash all over body  . Dizziness and weakness   Immunizations Administered    Name Date Dose VIS Date Route   Pfizer COVID-19 Vaccine 10/12/2019  9:19 AM 0.3 mL 07/09/2019 Intramuscular   Manufacturer: [employer], [employer]   Lot: [id number]   NDC: [address]-9

## 2020-11-18 IMAGING — CR DG CHEST 2V
2 series · 2 of 2 positions shown · non-contrast
Comparison: 08/30/2018.

CLINICAL DATA: Cough for 12 days. Fever. Wheezing.

EXAM:
CHEST - 2 VIEW

[chest pa]
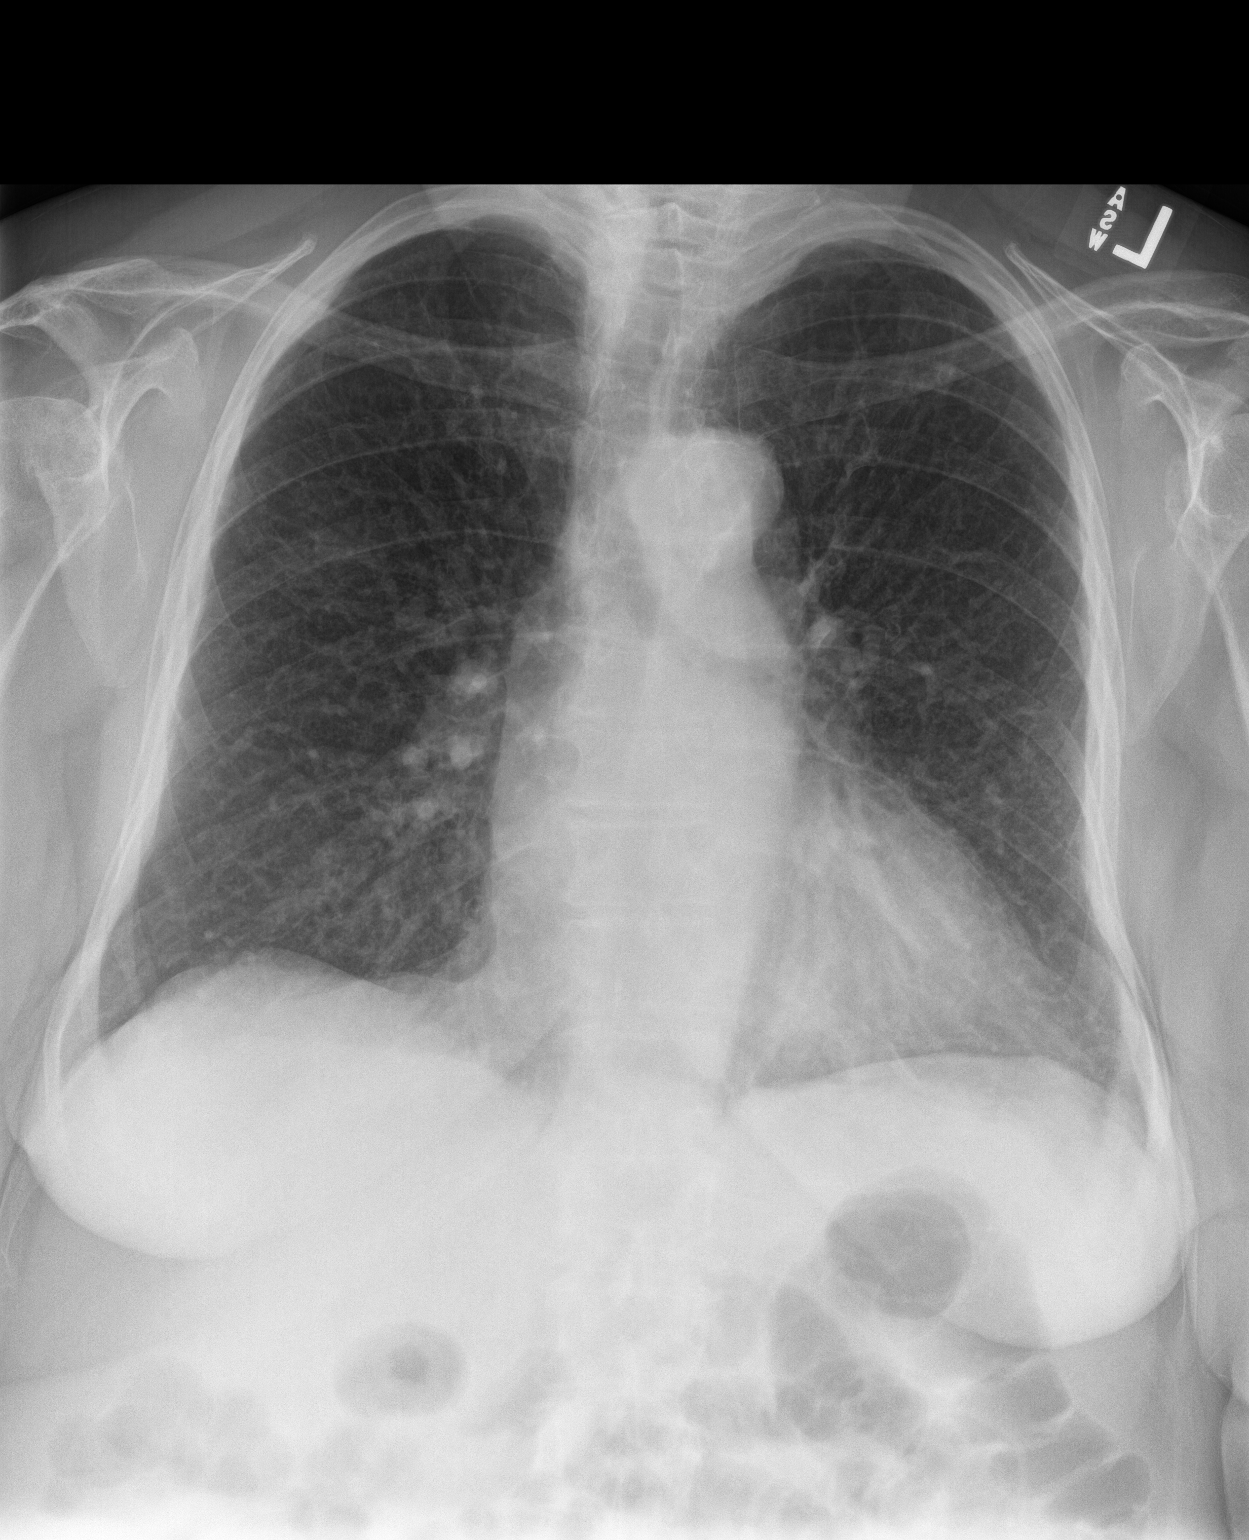

[chest lat]
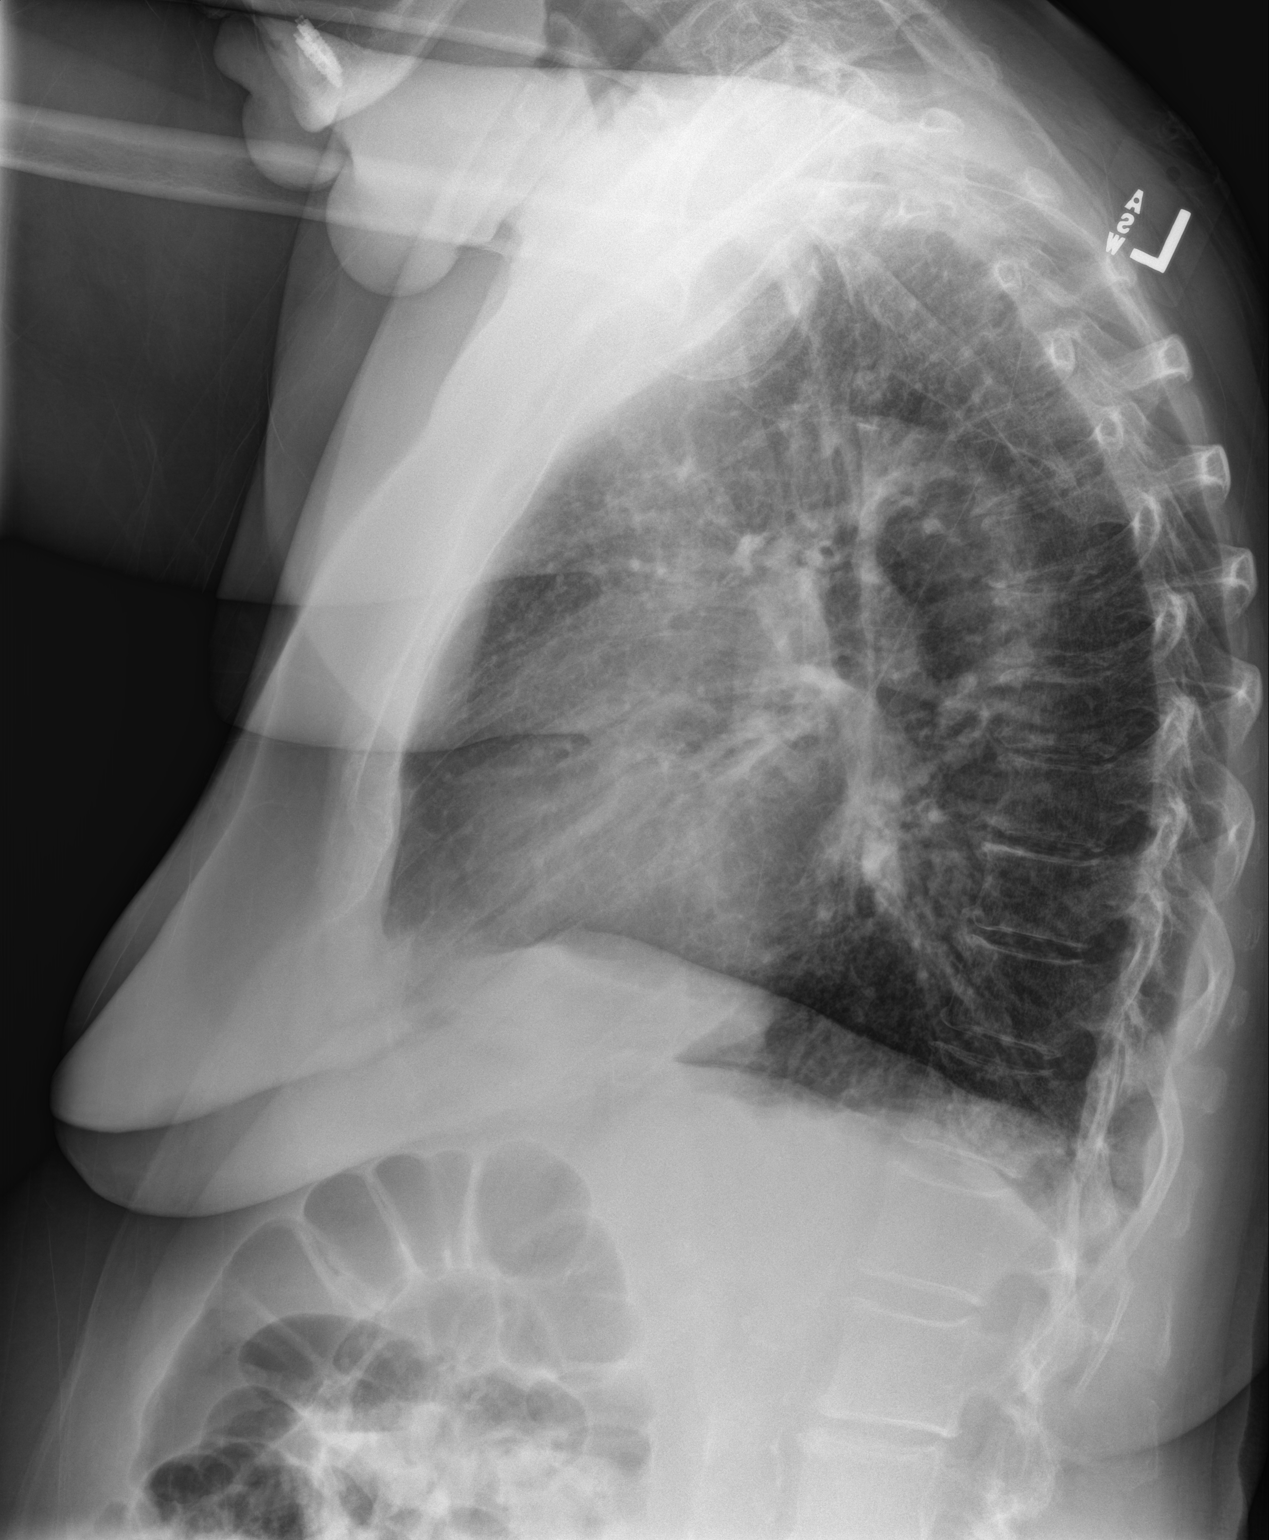

[2 of 2 positions shown; findings below may reference images not displayed]

FINDINGS: Cardiomegaly. Calcified tortuous aorta. Improved aeration compared
with priors. Mild interstitial prominence of a chronic nature, but
no pneumonia or pulmonary edema. Skeletal osteopenia.
IMPRESSION: No active cardiopulmonary disease. Chronic interstitial prominence,
but no consolidation edema. Improved aeration from priors.

## 2023-05-26 ENCOUNTER — Ambulatory Visit
Admission: RE | Admit: 2023-05-26 | Discharge: 2023-05-26 | Disposition: A | Discharge status: Home / Self Care | Payer: Medicare HMO | Source: Ambulatory Visit | Attending: Unknown Physician Specialty | Admitting: Unknown Physician Specialty

## 2023-05-26 ENCOUNTER — Other Ambulatory Visit: Payer: Self-pay | Admitting: Unknown Physician Specialty

## 2023-05-26 DIAGNOSIS — R1013 Epigastric pain: Secondary | ICD-10-CM | POA: Diagnosis present

## 2023-05-26 MED ORDER — IOHEXOL 300 MG/ML  SOLN
100.0000 mL | Freq: Once | INTRAMUSCULAR | Status: AC | PRN
  Administered 2023-05-26: 100 mL via INTRAVENOUS
